# Patient Record
Sex: Female | Born: 1960 | Race: White | Hispanic: No | Smoking: Former smoker
Health system: Southern US, Community
[De-identification: ages and names within clinical notes are randomized; demographics above are authoritative.]

## PROBLEM LIST (undated history)

## (undated) DIAGNOSIS — E041 Nontoxic single thyroid nodule: Secondary | ICD-10-CM

## (undated) DIAGNOSIS — C801 Malignant (primary) neoplasm, unspecified: Secondary | ICD-10-CM

## (undated) DIAGNOSIS — I10 Essential (primary) hypertension: Secondary | ICD-10-CM

## (undated) DIAGNOSIS — M797 Fibromyalgia: Secondary | ICD-10-CM

## (undated) DIAGNOSIS — I499 Cardiac arrhythmia, unspecified: Secondary | ICD-10-CM

## (undated) DIAGNOSIS — K219 Gastro-esophageal reflux disease without esophagitis: Secondary | ICD-10-CM

## (undated) DIAGNOSIS — I471 Supraventricular tachycardia, unspecified: Secondary | ICD-10-CM

## (undated) DIAGNOSIS — K589 Irritable bowel syndrome without diarrhea: Secondary | ICD-10-CM

## (undated) DIAGNOSIS — M1990 Unspecified osteoarthritis, unspecified site: Secondary | ICD-10-CM

## (undated) HISTORY — PX: KNEE ARTHROPLASTY: SHX992

## (undated) HISTORY — DX: Supraventricular tachycardia: I47.1

## (undated) HISTORY — DX: Supraventricular tachycardia, unspecified: I47.10

## (undated) HISTORY — PX: ABDOMINAL HYSTERECTOMY: SHX81

---

## 2008-01-06 HISTORY — PX: HIP ARTHROSCOPY: SHX668

## 2017-04-15 ENCOUNTER — Other Ambulatory Visit: Payer: Self-pay | Admitting: Internal Medicine

## 2017-04-15 DIAGNOSIS — R5381 Other malaise: Secondary | ICD-10-CM

## 2017-04-19 ENCOUNTER — Other Ambulatory Visit: Payer: Self-pay | Admitting: Internal Medicine

## 2017-04-20 ENCOUNTER — Other Ambulatory Visit: Payer: Self-pay | Admitting: Internal Medicine

## 2017-04-20 DIAGNOSIS — E2839 Other primary ovarian failure: Secondary | ICD-10-CM

## 2017-05-18 ENCOUNTER — Ambulatory Visit
Admission: RE | Admit: 2017-05-18 | Discharge: 2017-05-18 | Disposition: A | Discharge status: Home / Self Care | Source: Ambulatory Visit | Attending: Internal Medicine | Admitting: Internal Medicine

## 2017-05-18 DIAGNOSIS — E2839 Other primary ovarian failure: Secondary | ICD-10-CM

## 2017-12-15 ENCOUNTER — Other Ambulatory Visit: Payer: Self-pay | Admitting: Gastroenterology

## 2017-12-15 DIAGNOSIS — R1084 Generalized abdominal pain: Secondary | ICD-10-CM

## 2017-12-22 ENCOUNTER — Other Ambulatory Visit

## 2017-12-27 ENCOUNTER — Other Ambulatory Visit

## 2017-12-31 ENCOUNTER — Ambulatory Visit
Admission: RE | Admit: 2017-12-31 | Discharge: 2017-12-31 | Disposition: A | Discharge status: Home / Self Care | Payer: Medicare Other | Source: Ambulatory Visit | Attending: Gastroenterology | Admitting: Gastroenterology

## 2017-12-31 ENCOUNTER — Encounter: Payer: Self-pay | Admitting: Radiology

## 2017-12-31 DIAGNOSIS — R1084 Generalized abdominal pain: Secondary | ICD-10-CM

## 2017-12-31 MED ORDER — IOPAMIDOL (ISOVUE-300) INJECTION 61%
100.0000 mL | Freq: Once | INTRAVENOUS | Status: AC | PRN
  Administered 2017-12-31: 100 mL via INTRAVENOUS

## 2019-02-25 ENCOUNTER — Ambulatory Visit: Payer: Medicare Other | Attending: Internal Medicine

## 2019-02-25 DIAGNOSIS — Z23 Encounter for immunization: Secondary | ICD-10-CM | POA: Insufficient documentation

## 2019-02-25 NOTE — Progress Notes (Signed)
   Covid-19 Vaccination Clinic  Name:  Quinci Perret    MRN: [id number] DOB: June 11, 1960  02/25/2019  Ms. Koesters was observed post Covid-19 immunization for 15 minutes without incidence. She was provided with Vaccine Information Sheet and instruction to access the V-Safe system.   Ms. Cata was instructed to call 911 with any severe reactions post vaccine: Marland Kitchen Difficulty breathing  . Swelling of your face and throat  . A fast heartbeat  . A bad rash all over your body  . Dizziness and weakness    Immunizations Administered    Name Date Dose VIS Date Route   Pfizer COVID-19 Vaccine 02/25/2019  8:28 AM 0.3 mL 12/16/2018 Intramuscular   Manufacturer: [place]   Lot: [id number]   [place]: [id number]

## 2019-03-21 ENCOUNTER — Ambulatory Visit: Payer: Medicare Other | Attending: Internal Medicine

## 2019-03-21 DIAGNOSIS — Z23 Encounter for immunization: Secondary | ICD-10-CM

## 2019-03-21 NOTE — Progress Notes (Signed)
   Covid-19 Vaccination Clinic  Name:  Amber Maldonado    MRN: [id number] DOB: June 11, 1960  03/21/2019  Amber Maldonado was observed post Covid-19 immunization for 15 minutes without incident. She was provided with Vaccine Information Sheet and instruction to access the V-Safe system.   Amber Maldonado was instructed to call 911 with any severe reactions post vaccine: Marland Kitchen Difficulty breathing  . Swelling of face and throat  . A fast heartbeat  . A bad rash all over body  . Dizziness and weakness   Immunizations Administered    Name Date Dose VIS Date Route   Pfizer COVID-19 Vaccine 03/21/2019  4:32 PM 0.3 mL 12/16/2018 Intramuscular   Manufacturer: [place]   Lot: [id number]   [place]: [id number]

## 2020-01-01 ENCOUNTER — Encounter (HOSPITAL_COMMUNITY): Payer: Self-pay

## 2020-01-01 ENCOUNTER — Emergency Department (HOSPITAL_COMMUNITY)
Admission: EM | Admit: 2020-01-01 | Discharge: 2020-01-01 | Disposition: A | Discharge status: Home / Self Care | Payer: Medicare Other | Attending: Emergency Medicine | Admitting: Emergency Medicine

## 2020-01-01 ENCOUNTER — Other Ambulatory Visit: Payer: Self-pay

## 2020-01-01 DIAGNOSIS — I471 Supraventricular tachycardia: Secondary | ICD-10-CM | POA: Insufficient documentation

## 2020-01-01 DIAGNOSIS — I10 Essential (primary) hypertension: Secondary | ICD-10-CM | POA: Diagnosis not present

## 2020-01-01 DIAGNOSIS — Z79899 Other long term (current) drug therapy: Secondary | ICD-10-CM | POA: Diagnosis not present

## 2020-01-01 DIAGNOSIS — Z96659 Presence of unspecified artificial knee joint: Secondary | ICD-10-CM | POA: Diagnosis not present

## 2020-01-01 DIAGNOSIS — Z20822 Contact with and (suspected) exposure to covid-19: Secondary | ICD-10-CM | POA: Insufficient documentation

## 2020-01-01 HISTORY — DX: Irritable bowel syndrome, unspecified: K58.9

## 2020-01-01 HISTORY — DX: Essential (primary) hypertension: I10

## 2020-01-01 LAB — CBC WITH DIFFERENTIAL/PLATELET
Abs Immature Granulocytes: 0.03 10*3/uL (ref 0.00–0.07)
Basophils Absolute: 0 10*3/uL (ref 0.0–0.1)
Basophils Relative: 0 %
Eosinophils Absolute: 0.1 10*3/uL (ref 0.0–0.5)
Eosinophils Relative: 1 %
HCT: 47.4 % — ABNORMAL HIGH (ref 36.0–46.0)
Hemoglobin: 16.4 g/dL — ABNORMAL HIGH (ref 12.0–15.0)
Immature Granulocytes: 0 %
Lymphocytes Relative: 30 %
Lymphs Abs: 2.9 10*3/uL (ref 0.7–4.0)
MCH: 33.3 pg (ref 26.0–34.0)
MCHC: 34.6 g/dL (ref 30.0–36.0)
MCV: 96.3 fL (ref 80.0–100.0)
Monocytes Absolute: 0.6 10*3/uL (ref 0.1–1.0)
Monocytes Relative: 6 %
Neutro Abs: 6.1 10*3/uL (ref 1.7–7.7)
Neutrophils Relative %: 63 %
Platelets: 227 10*3/uL (ref 150–400)
RBC: 4.92 MIL/uL (ref 3.87–5.11)
RDW: 12.6 % (ref 11.5–15.5)
WBC: 9.8 10*3/uL (ref 4.0–10.5)
nRBC: 0 % (ref 0.0–0.2)

## 2020-01-01 LAB — BASIC METABOLIC PANEL
Anion gap: 11 (ref 5–15)
BUN: 19 mg/dL (ref 6–20)
CO2: 18 mmol/L — ABNORMAL LOW (ref 22–32)
Calcium: 9.2 mg/dL (ref 8.9–10.3)
Chloride: 112 mmol/L — ABNORMAL HIGH (ref 98–111)
Creatinine, Ser: 0.81 mg/dL (ref 0.44–1.00)
GFR, Estimated: >60 mL/min (ref >60)
Glucose, Bld: 123 mg/dL — ABNORMAL HIGH (ref 70–99)
Potassium: 3.7 mmol/L (ref 3.5–5.1)
Sodium: 141 mmol/L (ref 135–145)

## 2020-01-01 LAB — SARS CORONAVIRUS 2 (TAT 6-24 HRS): SARS Coronavirus 2: NEGATIVE

## 2020-01-01 LAB — TROPONIN I (HIGH SENSITIVITY)
Troponin I (High Sensitivity): 5 ng/L (ref <18)
Troponin I (High Sensitivity): 9 ng/L (ref <18)

## 2020-01-01 LAB — MAGNESIUM: Magnesium: 2.1 mg/dL (ref 1.7–2.4)

## 2020-01-01 MED ORDER — LACTATED RINGERS IV BOLUS
1000.0000 mL | Freq: Once | INTRAVENOUS | Status: AC
  Administered 2020-01-01: 12:00:00 1000 mL via INTRAVENOUS

## 2020-01-01 NOTE — ED Triage Notes (Signed)
Pt bib ems for SVT. 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 When EMS arrived pt was in SVT at 170s. Pt given 6mg  adenosine with no changes, then given 12 mg adenosine and converted to ST at 120. En route pt went back into SVT at 170s. Pt given 20mg  cadizem and converted to ST again 120s. Pt a.o, BP stable

## 2020-01-01 NOTE — ED Provider Notes (Signed)
[facility] EMERGENCY DEPARTMENT Provider Note   CSN: [id number] Arrival date & time: 01/01/20  1057     History Chief Complaint  Patient presents with  . Tachycardia    Amber Maldonado is a 59 y.o. female.  Patient had upset stomach, palpitations, was seen by EMS providers and found to be in SVT, given adenosine 6 mg and 12 mg, converted, then went back into SVT and was given 20 mg of Cardizem and then converted to sinus tachycardia.  She states this was new, she has a history of constipation and takes MiraLAX, usually when she does take MiraLAX it makes her stomach upset a little bit.  She says this happened to her once before but not nearly as severe, it happened several weeks ago.  She has no medical problems related to her heart other than high blood pressure and takes medication for it.  She denies any current chest pain any current shortness of breath and is much really resolved symptoms of palpitations perioral numbness tingling and shortness of breath.  Recent sick contacts no recent illness no recent trauma.        Past Medical History:  Diagnosis Date  . Hypertension   . Irritable bowel disease     There are no problems to display for this patient.   Past Surgical History:  Procedure Laterality Date  . KNEE ARTHROPLASTY       OB History   No obstetric history on file.     No family history on file.     Home Medications Prior to Admission medications   Medication Sig Start Date End Date Taking? Authorizing Provider  acetaminophen (TYLENOL) 500 MG tablet Take 1,000 mg by mouth 2 (two) times daily.   Yes [provider]  amoxicillin (AMOXIL) 500 MG capsule Take 2,000 mg by mouth See admin instructions. Take four capsules (2000 mg) by mouth one hour before dental appointments 08/04/19  Yes [provider]  Ascorbic Acid (VITAMIN C) 1000 MG tablet Take 1,000 mg by mouth every evening.   Yes [provider]   cholecalciferol (VITAMIN D) 25 MCG (1000 UNIT) tablet Take 3,000 Units by mouth every evening.   Yes [provider]  cyanocobalamin (,VITAMIN B-12,) 1000 MCG/ML injection Inject 1,000 mcg into the muscle every 30 (thirty) days.   Yes [provider]  dicyclomine (BENTYL) 20 MG tablet Take 20 mg by mouth 2 (two) times daily.   Yes [provider]  FIBER ADULT GUMMIES PO Take 1 tablet by mouth every evening.   Yes [provider]  lisinopril (ZESTRIL) 5 MG tablet Take 5 mg by mouth daily. 12/15/19  Yes [provider]  meloxicam (MOBIC) 15 MG tablet Take 7.5 mg by mouth daily. 12/25/19  Yes [provider]  Nutritional Supplements (ESTROVEN PO) Take 1 tablet by mouth daily.   Yes [provider]  pantoprazole (PROTONIX) 40 MG tablet Take 40 mg by mouth daily. 09/19/19  Yes [provider]  phentermine (ADIPEX-P) 37.5 MG tablet Take 18.75 mg by mouth See admin instructions. Take 1/2 tablet (18.75 mg) by mouth three times weekly as needed for appetite suppresant   Yes [provider]  polyethylene glycol (MIRALAX / GLYCOLAX) 17 g packet Take 17 g by mouth daily as needed (constipation).   Yes [provider]  topiramate (TOPAMAX) 50 MG tablet Take 50 mg by mouth 2 (two) times daily. 11/16/19  Yes [provider]  vitamin B-12 (CYANOCOBALAMIN) 1000 MCG  tablet Take 1,000 mcg by mouth daily.   Yes [provider]  Zinc 50 MG TABS Take 50 mg by mouth daily.   Yes [provider]    Allergies    Triamcinolone  Review of Systems   Review of Systems  Constitutional: Negative for chills and fever.  HENT: Negative for congestion and rhinorrhea.   Respiratory: Positive for shortness of breath. Negative for cough.   Cardiovascular: Positive for palpitations. Negative for chest pain.  Gastrointestinal: Positive for diarrhea. Negative for nausea and vomiting.  Genitourinary: Negative for  difficulty urinating and dysuria.  Musculoskeletal: Negative for arthralgias and back pain.  Skin: Negative for rash and wound.  Neurological: Positive for light-headedness and numbness (perioral). Negative for headaches.    Physical Exam Updated Vital Signs BP 127/87   Pulse 98   Temp 97.9 F (36.6 C) (Oral)   Resp 13   Ht 5' (1.524 m)   Wt 71.2 kg   SpO2 100%   BMI 30.66 kg/m   Physical Exam Vitals and nursing note reviewed. Exam conducted with a chaperone present.  Constitutional:      General: She is not in acute distress.    Appearance: Normal appearance.  HENT:     Head: Normocephalic and atraumatic.     Nose: No rhinorrhea.  Eyes:     General:        Right eye: No discharge.        Left eye: No discharge.     Conjunctiva/sclera: Conjunctivae normal.  Cardiovascular:     Rate and Rhythm: Regular rhythm. Tachycardia present.     Heart sounds: No murmur heard. No gallop.   Pulmonary:     Effort: Pulmonary effort is normal. No respiratory distress.     Breath sounds: No stridor.  Abdominal:     General: Abdomen is flat. There is no distension.     Palpations: Abdomen is soft.     Tenderness: There is no abdominal tenderness.  Musculoskeletal:        General: No tenderness or signs of injury.  Skin:    General: Skin is warm and dry.     Capillary Refill: Capillary refill takes less than 2 seconds.  Neurological:     General: No focal deficit present.     Mental Status: She is alert. Mental status is at baseline.     Motor: No weakness.  Psychiatric:        Mood and Affect: Mood normal.        Behavior: Behavior normal.     ED Results / Procedures / Treatments   Labs (all labs ordered are listed, but only abnormal results are displayed) Labs Reviewed  CBC WITH DIFFERENTIAL/PLATELET - Abnormal; Notable for the following components:      Result Value   Hemoglobin 16.4 (*)    HCT 47.4 (*)    All other components within normal limits  BASIC METABOLIC  PANEL - Abnormal; Notable for the following components:   Chloride 112 (*)    CO2 18 (*)    Glucose, Bld 123 (*)    All other components within normal limits  SARS CORONAVIRUS 2 (TAT 6-24 HRS)  MAGNESIUM  TROPONIN I (HIGH SENSITIVITY)  TROPONIN I (HIGH SENSITIVITY)    EKG EKG Interpretation  Date/Time:  Monday January 01 2020 10:58:20 EST Ventricular Rate:  117 PR Interval:    QRS Duration: 88 QT Interval:  330 QTC Calculation: 461 R Axis:   81 Text Interpretation: Sinus  tachycardia Right atrial enlargement Borderline ST depression, diffuse leads Confirmed by Dewaine Conger [phone] on 01/01/2020 11:38:24 AM   Radiology No results found.  Procedures Procedures (including critical care time)  Medications Ordered in ED Medications  lactated ringers bolus 1,000 mL (0 mLs Intravenous Stopped 01/01/20 1243)    ED Course  I have reviewed the triage vital signs and the nursing notes.  Pertinent labs & imaging results that were available during my care of the patient were reviewed by me and considered in my medical decision making (see chart for details).    MDM Rules/Calculators/A&P                          SVT, patient does take stimulant for appetite suppression for weight loss.  Also is having very loose stools in large quantities today secondary to MiraLAX use to treat constipation.  Sinus tachycardia without any specific focal acute ischemic change.  We will repeat as there is a lot of wander.  Will get IV fluids will get electrolytes.  Covid test.  Will get cardiac biomarkers.  Patient symptoms are much resolved after treatment.  Will likely touch base with cardiology to help arrange follow-up.  For troponins negative.  Viral testing pending.  Other labs fairly unremarkable except for nonanion gap metabolic acidosis likely secondary to the loose stools.  I spoke to the cardiologist, they recommend outpatient follow-up with return precautions.  Waiting for second troponin and  discharge.  Second troponin shows a level of 9 change of 4. Except amount of climb specially in the setting of recent SVT. Safe for discharge home outpatient follow-up return precautions provided  Final Clinical Impression(s) / ED Diagnoses Final diagnoses:  SVT (supraventricular tachycardia) ([place])    Rx / DC Orders ED Discharge Orders    None       Breck Coons, MD 01/01/20 1420

## 2020-01-01 NOTE — Discharge Instructions (Addendum)
Stay well-hydrated, follow-up with your primary care provider.  The cardiologist will help you schedule appointment, call their office.  Return to [place] with any concerns or symptoms that you cannot resolve.  Avoid using the appetite suppressant medication until seen by the cardiologist.

## 2020-01-03 ENCOUNTER — Ambulatory Visit: Payer: Medicare Other | Admitting: Cardiovascular Disease

## 2020-01-17 ENCOUNTER — Ambulatory Visit: Payer: Medicare Other | Admitting: Internal Medicine

## 2020-02-01 ENCOUNTER — Ambulatory Visit (INDEPENDENT_AMBULATORY_CARE_PROVIDER_SITE_OTHER): Payer: Medicare Other | Admitting: Internal Medicine

## 2020-02-01 ENCOUNTER — Encounter: Payer: Self-pay | Admitting: Internal Medicine

## 2020-02-01 ENCOUNTER — Other Ambulatory Visit: Payer: Self-pay

## 2020-02-01 VITALS — BP 120/80 | HR 72 | Ht 60.0 in | Wt 163.0 lb

## 2020-02-01 DIAGNOSIS — I471 Supraventricular tachycardia, unspecified: Secondary | ICD-10-CM | POA: Insufficient documentation

## 2020-02-01 DIAGNOSIS — I10 Essential (primary) hypertension: Secondary | ICD-10-CM

## 2020-02-01 MED ORDER — DILTIAZEM HCL 30 MG PO TABS: 30.0000 mg | ORAL_TABLET | Freq: Four times a day (QID) | ORAL | 0 refills | Status: DC | PRN

## 2020-02-01 NOTE — Progress Notes (Signed)
Cardiology Office Note:    Date:  02/01/2020   ID:  Amber Maldonado, DOB 10-Jul-1960, MRN [id number]  PCP:  [name], MD  [facility] Cardiologist:  No primary care provider on file.  [facility] Electrophysiologist:  None   CC: Palpitations Consulted for the evaluation of SVT at the behest of [name], MD  History of Present Illness:    Amber Maldonado is a 59 y.o. female with a hx of HTN, SVT who presents for evaluation.  Patient notes that she is feeling fine after her ED visit.  Patient had rare PVCs when she was in her doctor's office (worked at [employer]).01/01/20 had sudden onset tachycardia when she was going to work.  Had felt like she was walking her 3 miles and felt her lips going numb., Had ED visit for tachycardia 01/01/20:  BIBEMS with heart rate of 170s NOS.  Cessation with adenosine and diltiazem.  ED course was unremarkable.   Has had no chest pain, chest pressure, chest tightness, chest stinging .  Discomfort  Has not occurred since.  Patient exertion notable for walking three miles a day with  and feels no symptoms.  No shortness of breath, DOE .  No PND or orthopnea.  No bendopnea, weight gain, leg swelling , or abdominal swelling.  No syncope or near syncope.  Patient reports prior cardiac testing including  distant stress test.  No history of pre-eclampsia or prematurity.  No Fen-Phen or drug use.  Past Medical History:  Diagnosis Date  . Hypertension   . Irritable bowel disease   . SVT (supraventricular tachycardia) (HCC)     Past Surgical History:  Procedure Laterality Date  . KNEE ARTHROPLASTY      Current Medications: Current Meds  Medication Sig  . acetaminophen (TYLENOL) 500 MG tablet Take 1,000 mg by mouth 2 (two) times daily.  Marland Kitchen amoxicillin (AMOXIL) 500 MG capsule Take 2,000 mg by mouth See admin instructions. Take four capsules (2000 mg) by mouth one hour before dental appointments  . Ascorbic Acid (VITAMIN C) 1000 MG  tablet Take 1,000 mg by mouth every evening.  . cholecalciferol (VITAMIN D) 25 MCG (1000 UNIT) tablet Take 3,000 Units by mouth every evening.  . cyanocobalamin (,VITAMIN B-12,) 1000 MCG/ML injection Inject 1,000 mcg into the muscle every 30 (thirty) days.  Marland Kitchen dicyclomine (BENTYL) 20 MG tablet Take 20 mg by mouth 2 (two) times daily.  Marland Kitchen FIBER ADULT GUMMIES PO Take 1 tablet by mouth every evening.  Marland Kitchen lisinopril (ZESTRIL) 5 MG tablet Take 5 mg by mouth daily.  . meloxicam (MOBIC) 15 MG tablet Take 7.5 mg by mouth daily.  . Nutritional Supplements (ESTROVEN PO) Take 1 tablet by mouth daily.  . pantoprazole (PROTONIX) 40 MG tablet Take 40 mg by mouth daily.  . polyethylene glycol (MIRALAX / GLYCOLAX) 17 g packet Take 17 g by mouth daily as needed (constipation).  . Saccharomyces boulardii (PROBIOTIC) 250 MG CAPS Take by mouth daily in the afternoon.  . topiramate (TOPAMAX) 50 MG tablet Take 50 mg by mouth as needed.  . Zinc 50 MG TABS Take 50 mg by mouth daily.  . [DISCONTINUED] vitamin B-12 (CYANOCOBALAMIN) 1000 MCG tablet Take 1,000 mcg by mouth daily.     Allergies:   Triamcinolone   Social History   Socioeconomic History  . Marital status: Legally Separated    Spouse name: Not on file  . Number of children: Not on file  . Years of education: Not on file  .  Highest education level: Not on file  Occupational History  . Not on file  Tobacco Use  . Smoking status: Former Research scientist (life sciences)  . Smokeless tobacco: Never Used  Substance and Sexual Activity  . Alcohol use: Yes    Comment: occassional  . Drug use: Never  . Sexual activity: Not Currently  Other Topics Concern  . Not on file  Social History Narrative  . 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  [place].  Mother has squamous cell cancer  Family History: History of coronary artery disease notable for no members. History of heart failure notable for no members. History of arrhythmia notable for no members. Doesn't know her biological father well.  ROS:   Please see the history of present illness.     All other systems reviewed and are negative.  EKGs/Labs/Other Studies Reviewed:    The following studies were reviewed today:  EKG:   02/01/20: Sinus Tachycardia RAE rate 100 nonspecific TWI  Recent Labs: 01/01/2020: BUN 19; Creatinine, Ser 0.81; Hemoglobin 16.4; Magnesium 2.1; Platelets 227; Potassium 3.7; Sodium 141  Recent Lipid Panel No results found for: CHOL, TRIG, HDL, CHOLHDL, VLDL, LDLCALC, LDLDIRECT   Risk Assessment/Calculations:     N/A  Physical Exam:    VS:  BP 120/80   Pulse 72   Ht 5' (1.524 m)   Wt 163 lb (73.9 kg)   SpO2 99%   BMI 31.83 kg/m     Wt Readings from Last 3 Encounters:  02/01/20 163 lb (73.9 kg)  01/01/20 157 lb (71.2 kg)    GEN:  Well nourished, well developed in no acute distress HEENT: Normal NECK: No JVD; No carotid bruits LYMPHATICS: No lymphadenopathy CARDIAC: RRR, no murmurs, rubs, gallops RESPIRATORY:  Clear to auscultation without rales, wheezing or rhonchi  ABDOMEN: Soft, non-tender, non-distended MUSCULOSKELETAL:  No edema; No deformity  SKIN: Warm and dry NEUROLOGIC:  Alert and oriented x 3 PSYCHIATRIC:  Normal affect   ASSESSMENT:    No diagnosis found. PLAN:    In order of problems listed above:  Palpitations; possible SVT - will obtain 30-day heart monitor ([employer]) - AV Nodal Therapy: will start wil diltiazem 30 mo PO PRN  Essential Hypertension - ambulatory blood pressure < 135/85, willcontinue ambulatory BP monitoring; gave education on how to perform ambulatory blood pressure monitoring including the frequency and technique; goal ambulatory blood pressure < 135/85 on average - continue home  medications with the exception of above - discussed diet (DASH/low sodium), and exercise/weight loss interventions   Three months follow up unless new symptoms or abnormal test results warranting change in plan  Would be reasonable for  Virtual Follow up Would be reasonable for  APP Follow up   Medication Adjustments/Labs and Tests Ordered: Current medicines are reviewed at length with the patient today.  Concerns regarding medicines are outlined above.  No orders of the defined types were placed in this encounter.  No orders of the defined types were placed in this encounter.   There are no Patient Instructions on file for this visit.   Signed, Werner Lean, MD  02/01/2020 8:38 AM    [place]

## 2020-02-01 NOTE — Patient Instructions (Signed)
Medication Instructions:  1) START DILTIAZEM 30 mg up to four times daily as needed when your heart rate is fast *If you need a refill on your cardiac medications before your next appointment, please call your pharmacy*  Testing/Procedures: Dr. Dennison Bulla recommends you wear a heart monitor for 30 days.  Follow-Up: Your provider recommends that you schedule a follow-up appointment in 2-3 months.

## 2020-02-14 ENCOUNTER — Ambulatory Visit (INDEPENDENT_AMBULATORY_CARE_PROVIDER_SITE_OTHER): Payer: Medicare Other

## 2020-02-14 DIAGNOSIS — I471 Supraventricular tachycardia: Secondary | ICD-10-CM | POA: Diagnosis not present

## 2020-02-27 ENCOUNTER — Other Ambulatory Visit: Payer: Self-pay | Admitting: Internal Medicine

## 2020-04-12 ENCOUNTER — Ambulatory Visit (INDEPENDENT_AMBULATORY_CARE_PROVIDER_SITE_OTHER): Payer: Medicare Other | Admitting: Internal Medicine

## 2020-04-12 ENCOUNTER — Encounter: Payer: Self-pay | Admitting: Internal Medicine

## 2020-04-12 ENCOUNTER — Other Ambulatory Visit: Payer: Self-pay

## 2020-04-12 VITALS — BP 126/68 | HR 73 | Ht 60.0 in | Wt 162.0 lb

## 2020-04-12 DIAGNOSIS — I471 Supraventricular tachycardia: Secondary | ICD-10-CM | POA: Diagnosis not present

## 2020-04-12 DIAGNOSIS — I10 Essential (primary) hypertension: Secondary | ICD-10-CM | POA: Diagnosis not present

## 2020-04-12 DIAGNOSIS — I700 Atherosclerosis of aorta: Secondary | ICD-10-CM

## 2020-04-12 DIAGNOSIS — R002 Palpitations: Secondary | ICD-10-CM | POA: Insufficient documentation

## 2020-04-12 NOTE — Patient Instructions (Addendum)
Medication Instructions:  Your physician recommends that you continue on your current medications as directed. Please refer to the Current Medication list given to you today.  *If you need a refill on your cardiac medications before your next appointment, please call your pharmacy*   Lab Work: Tuesday April 16, 2020 : Fasting lipid panel If you have labs (blood work) drawn today and your tests are completely normal, you will receive your results only by: Marland Kitchen MyChart Message (if you have MyChart) OR . A paper copy in the mail If you have any lab test that is abnormal or we need to change your treatment, we will call you to review the results.   Testing/Procedures: NONE   Follow-Up: At [facility], you and your health needs are our priority.  As part of our continuing mission to provide you with exceptional heart care, we have created designated Provider Care Teams.  These Care Teams include your primary Cardiologist (physician) and Advanced Practice Providers (APPs -  Physician Assistants and Nurse Practitioners) who all work together to provide you with the care you need, when you need it.  We recommend signing up for the patient portal called "MyChart".  Sign up information is provided on this After Visit Summary.  MyChart is used to connect with patients for Virtual Visits (Telemedicine).  Patients are able to view lab/test results, encounter notes, upcoming appointments, etc.  Non-urgent messages can be sent to your provider as well.   To learn more about what you can do with MyChart, go to [email or web address].    Your next appointment:   12 month(s)  The format for your next appointment:   In Person  Provider:   You may see Gasper Sells, MD or one of the following Advanced Practice Providers on your designated Care Team:    Melina Copa, PA-C  Ermalinda Barrios, PA-C

## 2020-04-12 NOTE — Progress Notes (Signed)
Cardiology Office Note:    Date:  04/12/2020   ID:  Amber Maldonado, DOB 03-28-1960, MRN [id number]  PCP:  [name], MD  [facility] Cardiologist:  Rudean Haskell MD [place] Electrophysiologist:  None   CC: SVT Follow up  History of Present Illness:    Amber Maldonado is a 60 y.o. female with a hx of HTN, SVT who presents for evaluation 02/01/20.  In interim of this visit, patient heart monitor with no further SVT.  Patient notes that she is doing well.  Since last visit notes no palpitations changes.  Relevant interval testing or therapy include negative ZioPatch.  There are no interval hospital/ED visit.  Started on anxiety medication, but hasn't taken any yet.  No chest pain or pressure.  No SOB/DOE and no PND/Orthopnea.  No weight gain or leg swelling.  No palpitations or syncope since last visit.  Ambulatory blood pressure 120/70s.   Past Medical History:  Diagnosis Date  . Hypertension   . Irritable bowel disease   . SVT (supraventricular tachycardia) (HCC)     Past Surgical History:  Procedure Laterality Date  . KNEE ARTHROPLASTY      Current Medications: Current Meds  Medication Sig  . acetaminophen (TYLENOL) 500 MG tablet Take 1,000 mg by mouth 2 (two) times daily.  Marland Kitchen amoxicillin (AMOXIL) 500 MG capsule Take 2,000 mg by mouth See admin instructions. Take four capsules (2000 mg) by mouth one hour before dental appointments  . Ascorbic Acid (VITAMIN C) 1000 MG tablet Take 1,000 mg by mouth every evening.  . cholecalciferol (VITAMIN D) 25 MCG (1000 UNIT) tablet Take 3,000 Units by mouth every evening.  . cyanocobalamin (,VITAMIN B-12,) 1000 MCG/ML injection Inject 1,000 mcg into the muscle every 30 (thirty) days.  Marland Kitchen dicyclomine (BENTYL) 20 MG tablet Take 20 mg by mouth 2 (two) times daily.  Marland Kitchen diltiazem (CARDIZEM) 30 MG tablet TAKE 1 TABLET(30 MG) BY MOUTH FOUR TIMES DAILY AS NEEDED FOR FAST HEART RATE  . FIBER ADULT GUMMIES PO Take 1 tablet by mouth  every evening.  Marland Kitchen lisinopril (ZESTRIL) 5 MG tablet Take 5 mg by mouth daily.  . meloxicam (MOBIC) 15 MG tablet Take 7.5 mg by mouth daily.  . Nutritional Supplements (ESTROVEN PO) Take 1 tablet by mouth daily.  . pantoprazole (PROTONIX) 40 MG tablet Take 40 mg by mouth daily.  . polyethylene glycol (MIRALAX / GLYCOLAX) 17 g packet Take 17 g by mouth daily as needed (constipation).  . Saccharomyces boulardii (PROBIOTIC) 250 MG CAPS Take by mouth daily in the afternoon.  . topiramate (TOPAMAX) 50 MG tablet Take 50 mg by mouth as needed.  . Zinc 50 MG TABS Take 50 mg by mouth daily.     Allergies:   Triamcinolone   Social History   Socioeconomic History  . Marital status: Legally Separated    Spouse name: Not on file  . Number of children: Not on file  . Years of education: Not on file  . Highest education level: Not on file  Occupational History  . Not on file  Tobacco Use  . Smoking status: Former Research scientist (life sciences)  . Smokeless tobacco: Never Used  Substance and Sexual Activity  . Alcohol use: Yes    Comment: occassional  . Drug use: Never  . Sexual activity: Not Currently  Other Topics Concern  . Not on file  Social History Narrative  . 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  Mother has squamous cell cancer Daughter has premature baby 19 weeks recently  Family History: History of coronary artery disease notable for no members. History of heart failure notable for no members. History of arrhythmia notable for no members. Doesn't know her biological father well.  ROS:   Please see the history of present illness.     All other systems reviewed and are negative.  EKGs/Labs/Other Studies Reviewed:    The following studies were  reviewed today:  EKG:   02/01/20: Sinus Tachycardia RAE rate 100 nonspecific TWI  Cardiac Event Monitoring: Date: 01/01/20 Results:  Patient had a minimum heart rate of 50 bpm, maximum heart rate of 147 bpm, and average heart rate of 78 bpm.  Predominant underlying rhythm was sinus rhythm.  Isolated PACs were rare (<1.0%).  Isolated PVCs were rare (<1.0%).  No evidence of atrial fibrillation.  Triggered and diary events associated with sinus rhythm and sinus tachycardia.   No malignant arrhythmias.  NonCardiac CT: Date 12/31/17 Results: Descending Aortic Atherosclerosis  Recent Labs: 01/01/2020: BUN 19; Creatinine, Ser 0.81; Hemoglobin 16.4; Magnesium 2.1; Platelets 227; Potassium 3.7; Sodium 141  Recent Lipid Panel No results found for: CHOL, TRIG, HDL, CHOLHDL, VLDL, LDLCALC, LDLDIRECT  Outside Hospital  or Outside Clinic Studies (OSH):  Date: 08/2019 Cholesterol 178 HDL 72 LDL 86 Tgs 111   Risk Assessment/Calculations:     N/A  Physical Exam:    VS:  BP 126/68   Pulse 73   Ht 5' (1.524 m)   Wt 162 lb (73.5 kg)   SpO2 99%   BMI 31.64 kg/m     Wt Readings from Last 3 Encounters:  04/12/20 162 lb (73.5 kg)  02/01/20 163 lb (73.9 kg)  01/01/20 157 lb (71.2 kg)    GEN:  Well nourished, well developed in no acute distress HEENT: Normal NECK: No JVD; No carotid bruits LYMPHATICS: No lymphadenopathy CARDIAC: RRR, no murmurs, rubs, gallops RESPIRATORY:  Clear to auscultation without rales, wheezing or rhonchi  ABDOMEN: Soft, non-tender, non-distended MUSCULOSKELETAL:  No edema; No deformity  SKIN: Warm and dry NEUROLOGIC:  Alert and oriented x 3 PSYCHIATRIC:  Normal affect   ASSESSMENT:    1. SVT (supraventricular tachycardia) ([place])   2. Essential hypertension   3. Aortic atherosclerosis ([facility])    PLAN:    In order of problems listed above:  Aortic Atherosclerosis -LDL goal less than 70 -continue current statin - gave education on dietary  changes - Reviewed CT scan with patient - discussed the [place] affect of statins  SVT - AV Nodal Therapy: continue diltiazem 30 mo PO PRN  Essential Hypertension - ambulatory blood pressure 120/70s, will continue ambulatory BP monitoring; gave education on how to perform ambulatory blood pressure monitoring including the frequency and technique; goal ambulatory blood pressure < 130/80 on average - continue home medications with the exception of above - discussed diet (DASH/low sodium), and exercise/weight loss interventions   One year follow up unless new symptoms or abnormal test results warranting change in plan  Would be reasonable for  APP Follow up   Medication Adjustments/Labs and Tests Ordered: Current medicines are reviewed at length with the patient today.  Concerns regarding medicines are outlined above.  Orders Placed This Encounter  Procedures  . Lipid  panel   No orders of the defined types were placed in this encounter.   Patient Instructions  Medication Instructions:  Your physician recommends that you continue on your current medications as directed. Please refer to the Current Medication list given to you today.  *If you need a refill on your cardiac medications before your next appointment, please call your pharmacy*   Lab Work: Tuesday April 16, 2020 : Fasting lipid panel If you have labs (blood work) drawn today and your tests are completely normal, you will receive your results only by: Marland Kitchen MyChart Message (if you have MyChart) OR . A paper copy in the mail If you have any lab test that is abnormal or we need to change your treatment, we will call you to review the results.   Testing/Procedures: NONE   Follow-Up: At [facility], you and your health needs are our priority.  As part of our continuing mission to provide you with exceptional heart care, we have created designated Provider Care Teams.  These Care Teams include your primary Cardiologist  (physician) and Advanced Practice Providers (APPs -  Physician Assistants and Nurse Practitioners) who all work together to provide you with the care you need, when you need it.  We recommend signing up for the patient portal called "MyChart".  Sign up information is provided on this After Visit Summary.  MyChart is used to connect with patients for Virtual Visits (Telemedicine).  Patients are able to view lab/test results, encounter notes, upcoming appointments, etc.  Non-urgent messages can be sent to your provider as well.   To learn more about what you can do with MyChart, go to [email or web address].    Your next appointment:   12 month(s)  The format for your next appointment:   In Person  Provider:   You may see Gasper Sells, MD or one of the following Advanced Practice Providers on your designated Care Team:    Melina Copa, PA-C  Ermalinda Barrios, PA-C         Signed, Werner Lean, MD  04/12/2020 8:56 AM    [place]

## 2020-04-16 ENCOUNTER — Other Ambulatory Visit: Payer: Medicare Other

## 2021-02-06 ENCOUNTER — Other Ambulatory Visit: Payer: Self-pay

## 2021-02-06 ENCOUNTER — Emergency Department (HOSPITAL_COMMUNITY): Payer: Medicare Other

## 2021-02-06 ENCOUNTER — Inpatient Hospital Stay (HOSPITAL_COMMUNITY)
Admission: EM | Admit: 2021-02-06 | Discharge: 2021-02-09 | DRG: 871 | Disposition: A | Discharge status: Home / Self Care | Payer: Medicare Other | Attending: Internal Medicine | Admitting: Internal Medicine

## 2021-02-06 ENCOUNTER — Encounter (HOSPITAL_COMMUNITY): Payer: Self-pay

## 2021-02-06 DIAGNOSIS — E041 Nontoxic single thyroid nodule: Secondary | ICD-10-CM | POA: Diagnosis present

## 2021-02-06 DIAGNOSIS — B9620 Unspecified Escherichia coli [E. coli] as the cause of diseases classified elsewhere: Secondary | ICD-10-CM | POA: Diagnosis present

## 2021-02-06 DIAGNOSIS — A419 Sepsis, unspecified organism: Principal | ICD-10-CM | POA: Diagnosis present

## 2021-02-06 DIAGNOSIS — E538 Deficiency of other specified B group vitamins: Secondary | ICD-10-CM | POA: Diagnosis present

## 2021-02-06 DIAGNOSIS — N12 Tubulo-interstitial nephritis, not specified as acute or chronic: Secondary | ICD-10-CM

## 2021-02-06 DIAGNOSIS — Z96659 Presence of unspecified artificial knee joint: Secondary | ICD-10-CM | POA: Diagnosis present

## 2021-02-06 DIAGNOSIS — M797 Fibromyalgia: Secondary | ICD-10-CM | POA: Diagnosis present

## 2021-02-06 DIAGNOSIS — I471 Supraventricular tachycardia: Secondary | ICD-10-CM | POA: Diagnosis present

## 2021-02-06 DIAGNOSIS — N10 Acute tubulo-interstitial nephritis: Secondary | ICD-10-CM | POA: Diagnosis not present

## 2021-02-06 DIAGNOSIS — K219 Gastro-esophageal reflux disease without esophagitis: Secondary | ICD-10-CM | POA: Diagnosis present

## 2021-02-06 DIAGNOSIS — E876 Hypokalemia: Secondary | ICD-10-CM | POA: Diagnosis not present

## 2021-02-06 DIAGNOSIS — Z888 Allergy status to other drugs, medicaments and biological substances status: Secondary | ICD-10-CM

## 2021-02-06 DIAGNOSIS — E669 Obesity, unspecified: Secondary | ICD-10-CM | POA: Diagnosis present

## 2021-02-06 DIAGNOSIS — I10 Essential (primary) hypertension: Secondary | ICD-10-CM

## 2021-02-06 DIAGNOSIS — U071 COVID-19: Secondary | ICD-10-CM | POA: Diagnosis present

## 2021-02-06 DIAGNOSIS — Z7982 Long term (current) use of aspirin: Secondary | ICD-10-CM

## 2021-02-06 DIAGNOSIS — Z87891 Personal history of nicotine dependence: Secondary | ICD-10-CM

## 2021-02-06 DIAGNOSIS — Z79899 Other long term (current) drug therapy: Secondary | ICD-10-CM

## 2021-02-06 DIAGNOSIS — Z6830 Body mass index (BMI) 30.0-30.9, adult: Secondary | ICD-10-CM

## 2021-02-06 LAB — CBC WITH DIFFERENTIAL/PLATELET
Abs Immature Granulocytes: 0.18 10*3/uL — ABNORMAL HIGH (ref 0.00–0.07)
Abs Immature Granulocytes: 0.19 10*3/uL — ABNORMAL HIGH (ref 0.00–0.07)
Basophils Absolute: 0.1 10*3/uL (ref 0.0–0.1)
Basophils Absolute: 0.1 10*3/uL (ref 0.0–0.1)
Basophils Relative: 0 %
Basophils Relative: 0 %
Eosinophils Absolute: 0 10*3/uL (ref 0.0–0.5)
Eosinophils Absolute: 0 10*3/uL (ref 0.0–0.5)
Eosinophils Relative: 0 %
Eosinophils Relative: 0 %
HCT: 37.3 % (ref 36.0–46.0)
HCT: 32 % — ABNORMAL LOW (ref 36.0–46.0)
Hemoglobin: 12.5 g/dL (ref 12.0–15.0)
Hemoglobin: 10.5 g/dL — ABNORMAL LOW (ref 12.0–15.0)
Immature Granulocytes: 1 %
Immature Granulocytes: 1 %
Lymphocytes Relative: 5 %
Lymphocytes Relative: 10 %
Lymphs Abs: 1 10*3/uL (ref 0.7–4.0)
Lymphs Abs: 1.8 10*3/uL (ref 0.7–4.0)
MCH: 30.4 pg (ref 26.0–34.0)
MCH: 29.9 pg (ref 26.0–34.0)
MCHC: 33.5 g/dL (ref 30.0–36.0)
MCHC: 32.8 g/dL (ref 30.0–36.0)
MCV: 90.8 fL (ref 80.0–100.0)
MCV: 91.2 fL (ref 80.0–100.0)
Monocytes Absolute: 1.9 10*3/uL — ABNORMAL HIGH (ref 0.1–1.0)
Monocytes Absolute: 2.3 10*3/uL — ABNORMAL HIGH (ref 0.1–1.0)
Monocytes Relative: 10 %
Monocytes Relative: 12 %
Neutro Abs: 14.9 10*3/uL — ABNORMAL HIGH (ref 1.7–7.7)
Neutro Abs: 14.3 10*3/uL — ABNORMAL HIGH (ref 1.7–7.7)
Neutrophils Relative %: 84 %
Neutrophils Relative %: 77 %
Platelets: 248 10*3/uL (ref 150–400)
Platelets: 217 10*3/uL (ref 150–400)
RBC: 4.11 MIL/uL (ref 3.87–5.11)
RBC: 3.51 MIL/uL — ABNORMAL LOW (ref 3.87–5.11)
RDW: 13.9 % (ref 11.5–15.5)
RDW: 14 % (ref 11.5–15.5)
WBC: 17.9 10*3/uL — ABNORMAL HIGH (ref 4.0–10.5)
WBC: 18.5 10*3/uL — ABNORMAL HIGH (ref 4.0–10.5)
nRBC: 0 % (ref 0.0–0.2)
nRBC: 0 % (ref 0.0–0.2)

## 2021-02-06 LAB — COMPREHENSIVE METABOLIC PANEL
ALT: 13 U/L (ref 0–44)
AST: 13 U/L — ABNORMAL LOW (ref 15–41)
Albumin: 3.2 g/dL — ABNORMAL LOW (ref 3.5–5.0)
Alkaline Phosphatase: 106 U/L (ref 38–126)
Anion gap: 9 (ref 5–15)
BUN: 8 mg/dL (ref 6–20)
CO2: 21 mmol/L — ABNORMAL LOW (ref 22–32)
Calcium: 8.6 mg/dL — ABNORMAL LOW (ref 8.9–10.3)
Chloride: 107 mmol/L (ref 98–111)
Creatinine, Ser: 0.63 mg/dL (ref 0.44–1.00)
GFR, Estimated: >60 mL/min (ref >60)
Glucose, Bld: 119 mg/dL — ABNORMAL HIGH (ref 70–99)
Potassium: 2.9 mmol/L — ABNORMAL LOW (ref 3.5–5.1)
Sodium: 137 mmol/L (ref 135–145)
Total Bilirubin: 0.7 mg/dL (ref 0.3–1.2)
Total Protein: 6.3 g/dL — ABNORMAL LOW (ref 6.5–8.1)

## 2021-02-06 LAB — URINALYSIS, MICROSCOPIC (REFLEX)

## 2021-02-06 LAB — LACTIC ACID, PLASMA: Lactic Acid, Venous: 0.8 mmol/L (ref 0.5–1.9)

## 2021-02-06 LAB — URINALYSIS, ROUTINE W REFLEX MICROSCOPIC
Bilirubin Urine: NEGATIVE
Glucose, UA: NEGATIVE mg/dL
Ketones, ur: NEGATIVE mg/dL
Nitrite: NEGATIVE
Protein, ur: NEGATIVE mg/dL
Specific Gravity, Urine: 1.015 (ref 1.005–1.030)
pH: 6.5 (ref 5.0–8.0)

## 2021-02-06 LAB — PROCALCITONIN: Procalcitonin: 0.61 ng/mL

## 2021-02-06 LAB — RESP PANEL BY RT-PCR (FLU A&B, COVID) ARPGX2
Influenza A by PCR: NEGATIVE
Influenza B by PCR: NEGATIVE
SARS Coronavirus 2 by RT PCR: POSITIVE — AB

## 2021-02-06 LAB — ABO/RH: ABO/RH(D): O POS

## 2021-02-06 LAB — HEMOGLOBIN A1C
Hgb A1c MFr Bld: 5.1 % (ref 4.8–5.6)
Mean Plasma Glucose: 99.67 mg/dL

## 2021-02-06 LAB — TROPONIN I (HIGH SENSITIVITY)
Troponin I (High Sensitivity): 7 ng/L (ref <18)
Troponin I (High Sensitivity): 5 ng/L (ref <18)

## 2021-02-06 LAB — MAGNESIUM: Magnesium: 1.6 mg/dL — ABNORMAL LOW (ref 1.7–2.4)

## 2021-02-06 MED ORDER — ONDANSETRON HCL 4 MG PO TABS: 4.0000 mg | ORAL_TABLET | Freq: Four times a day (QID) | ORAL | Status: DC | PRN

## 2021-02-06 MED ORDER — MAGNESIUM SULFATE 2 GM/50ML IV SOLN
2.0000 g | Freq: Once | INTRAVENOUS | Status: AC
  Administered 2021-02-06: 2 g via INTRAVENOUS
  Filled 2021-02-06: qty 50

## 2021-02-06 MED ORDER — POTASSIUM CHLORIDE CRYS ER 20 MEQ PO TBCR
20.0000 meq | EXTENDED_RELEASE_TABLET | Freq: Once | ORAL | Status: AC
  Administered 2021-02-06: 20 meq via ORAL
  Filled 2021-02-06: qty 1

## 2021-02-06 MED ORDER — ACETAMINOPHEN 325 MG PO TABS
650.0000 mg | ORAL_TABLET | Freq: Once | ORAL | Status: AC
Start: 2021-02-06 — End: 2021-02-06
  Administered 2021-02-06: 650 mg via ORAL
  Filled 2021-02-06: qty 2

## 2021-02-06 MED ORDER — ENOXAPARIN SODIUM 40 MG/0.4ML IJ SOSY
40.0000 mg | PREFILLED_SYRINGE | INTRAMUSCULAR | Status: DC
  Administered 2021-02-06 – 2021-02-07 (×2): 40 mg via SUBCUTANEOUS
  Filled 2021-02-06 (×3): qty 0.4

## 2021-02-06 MED ORDER — SODIUM CHLORIDE 0.9 % IV BOLUS
1000.0000 mL | Freq: Once | INTRAVENOUS | Status: AC
  Administered 2021-02-06: 1000 mL via INTRAVENOUS

## 2021-02-06 MED ORDER — SODIUM CHLORIDE 0.9 % IV SOLN
2.0000 g | INTRAVENOUS | Status: DC
  Administered 2021-02-07 – 2021-02-08 (×2): 2 g via INTRAVENOUS
  Filled 2021-02-06 (×2): qty 20

## 2021-02-06 MED ORDER — HYDROCODONE-ACETAMINOPHEN 5-325 MG PO TABS
1.0000 | ORAL_TABLET | ORAL | Status: DC | PRN
  Administered 2021-02-07 (×2): 2 via ORAL
  Administered 2021-02-07 – 2021-02-08 (×3): 1 via ORAL
  Administered 2021-02-08 (×2): 2 via ORAL
  Filled 2021-02-06: qty 1
  Filled 2021-02-06: qty 2
  Filled 2021-02-06 (×2): qty 1
  Filled 2021-02-06 (×5): qty 2

## 2021-02-06 MED ORDER — SODIUM CHLORIDE 0.9 % IV SOLN
1.0000 g | Freq: Once | INTRAVENOUS | Status: AC
  Administered 2021-02-06: 1 g via INTRAVENOUS
  Filled 2021-02-06: qty 10

## 2021-02-06 MED ORDER — LACTATED RINGERS IV BOLUS
1000.0000 mL | Freq: Once | INTRAVENOUS | Status: AC
  Administered 2021-02-06: 1000 mL via INTRAVENOUS

## 2021-02-06 MED ORDER — ONDANSETRON HCL 4 MG/2ML IJ SOLN: 4.0000 mg | Freq: Four times a day (QID) | INTRAMUSCULAR | Status: DC | PRN

## 2021-02-06 MED ORDER — ACETAMINOPHEN 325 MG PO TABS
650.0000 mg | ORAL_TABLET | Freq: Four times a day (QID) | ORAL | Status: DC | PRN
  Administered 2021-02-07 – 2021-02-09 (×3): 650 mg via ORAL
  Filled 2021-02-06 (×4): qty 2

## 2021-02-06 MED ORDER — KETOROLAC TROMETHAMINE 15 MG/ML IJ SOLN
15.0000 mg | Freq: Once | INTRAMUSCULAR | Status: AC
  Administered 2021-02-06: 15 mg via INTRAVENOUS
  Filled 2021-02-06: qty 1

## 2021-02-06 MED ORDER — INSULIN ASPART 100 UNIT/ML IJ SOLN: 0.0000 [IU] | INTRAMUSCULAR | Status: DC

## 2021-02-06 MED ORDER — SODIUM CHLORIDE 0.9% FLUSH
3.0000 mL | Freq: Two times a day (BID) | INTRAVENOUS | Status: DC
  Administered 2021-02-07 – 2021-02-09 (×5): 3 mL via INTRAVENOUS

## 2021-02-06 MED ORDER — GUAIFENESIN-DM 100-10 MG/5ML PO SYRP
10.0000 mL | ORAL_SOLUTION | ORAL | Status: DC | PRN
  Filled 2021-02-06: qty 10

## 2021-02-06 MED ORDER — IOHEXOL 350 MG/ML SOLN
100.0000 mL | Freq: Once | INTRAVENOUS | Status: AC | PRN
  Administered 2021-02-06: 100 mL via INTRAVENOUS

## 2021-02-06 MED ORDER — POTASSIUM CHLORIDE 10 MEQ/100ML IV SOLN
10.0000 meq | INTRAVENOUS | Status: AC
  Administered 2021-02-06 (×2): 10 meq via INTRAVENOUS
  Filled 2021-02-06 (×2): qty 100

## 2021-02-06 MED ORDER — POTASSIUM CHLORIDE 10 MEQ/100ML IV SOLN
10.0000 meq | INTRAVENOUS | Status: AC
  Administered 2021-02-06 – 2021-02-07 (×2): 10 meq via INTRAVENOUS
  Filled 2021-02-06 (×2): qty 100

## 2021-02-06 MED ORDER — ONDANSETRON HCL 4 MG/2ML IJ SOLN
4.0000 mg | Freq: Once | INTRAMUSCULAR | Status: AC
  Administered 2021-02-06: 4 mg via INTRAVENOUS
  Filled 2021-02-06: qty 2

## 2021-02-06 NOTE — H&P (Signed)
Amber Maldonado LNZ:[id number] DOB: 07-16-1960 DOA: 02/06/2021     PCP: [name], MD   Outpatient Specialists: * NONE CARDS:   Dr.Mahesh Chandrasekhar     Patient arrived to ER on 02/06/21 at 1401 Referred by Attending Toy Baker, MD   Patient coming from: home Lives alone,      Chief Complaint:   Chief Complaint  Patient presents with   Generalized Body Aches   Dizziness    HPI: Amber Maldonado is a 61 y.o. female with medical history significant of  fibromyalgia, HTN, SVT CKD, fibromyalgia  Presented with chills, feeling unwell Complaining of body aches since last night, she was diagnosed with COVID 3 wks ago was treated with Paxlovid yesterday symptoms recurred, At home Covid test was negative and turned positive again on arrival Feeling very light headed like she is going to pass out Recently was treated for UTI with Macrobid Symptoms improved but not resolved No fever at home   Pt also was given a cource of amoxicilin for URI and steroids Has  been vaccinated against COVID  and boosted    Initial COVID TEST   POSITIVE      Lab Results  Component Value Date   [place] (A) 02/06/2021   [place] NEGATIVE 01/01/2020     Regarding pertinent Chronic problems:      HTN on lisinopril  Hx of SVT - on Diltiazem       DM 2 - No results found for: HGBA1C  on PO meds only,        While in ER:   UA looked dirty and CT showed evidence of pyelo, pt meeting SEPSIS criteria    Ordered  CT HEAD   NON acute  CXR -  NON acute  CTabd/pelvis -  Right striated nephrogram suggestive of pyelonephritis A 1.9 cm hypodense right thyroid gland nodule. Recommend thyroid US hepatomegaly CTA chest -  nonacute, no PE, no evidence of infiltrate   Following Medications were ordered in ER: Medications  potassium chloride 10 mEq in 100 mL IVPB (10 mEq Intravenous New Bag/Given 02/06/21 2105)  magnesium sulfate IVPB 2 g 50 mL (2 g Intravenous New  Bag/Given 02/06/21 2129)  lactated ringers bolus 1,000 mL (1,000 mLs Intravenous New Bag/Given 02/06/21 1808)  acetaminophen (TYLENOL) tablet 650 mg (650 mg Oral Given 02/06/21 1800)  ondansetron (ZOFRAN) injection 4 mg (4 mg Intravenous Given 02/06/21 1809)  potassium chloride SA (KLOR-CON M) CR tablet 20 mEq (20 mEq Oral Given 02/06/21 1800)  sodium chloride 0.9 % bolus 1,000 mL (0 mLs Intravenous Stopped 02/06/21 1930)  cefTRIAXone (ROCEPHIN) 1 g in sodium chloride 0.9 % 100 mL IVPB (0 g Intravenous Stopped 02/06/21 2001)  ketorolac (TORADOL) 15 MG/ML injection 15 mg (15 mg Intravenous Given 02/06/21 1942)  iohexol (OMNIPAQUE) 350 MG/ML injection 100 mL (100 mLs Intravenous Contrast Given 02/06/21 2038)       ED Triage Vitals  Enc Vitals Group     BP 02/06/21 1408 134/88     Pulse Rate 02/06/21 1408 (!) 132     Resp 02/06/21 1408 14     Temp 02/06/21 1408 97.9 F (36.6 C)     Temp Source 02/06/21 1933 Oral     SpO2 02/06/21 1408 97 %     Weight 02/06/21 1448 157 lb (71.2 kg)     Height 02/06/21 1448 5' (1.524 m)     Head Circumference --      Peak Flow --  Pain Score 02/06/21 1447 8     Pain Loc --      Pain Edu? --      Excl. in [place]? --   TMAX(24)@     _________________________________________ Significant initial  Findings: Abnormal Labs Reviewed  RESP PANEL BY RT-PCR (FLU A&B, COVID) ARPGX2 - Abnormal; Notable for the following components:      Result Value   SARS Coronavirus 2 by RT PCR POSITIVE (*)    All other components within normal limits  COMPREHENSIVE METABOLIC PANEL - Abnormal; Notable for the following components:   Potassium 2.9 (*)    CO2 21 (*)    Glucose, Bld 119 (*)    Calcium 8.6 (*)    Total Protein 6.3 (*)    Albumin 3.2 (*)    AST 13 (*)    All other components within normal limits  CBC WITH DIFFERENTIAL/PLATELET - Abnormal; Notable for the following components:   WBC 17.9 (*)    Neutro Abs 14.9 (*)    Monocytes Absolute 1.9 (*)    Abs Immature Granulocytes  0.18 (*)    All other components within normal limits  URINALYSIS, ROUTINE W REFLEX MICROSCOPIC - Abnormal; Notable for the following components:   Hgb urine dipstick SMALL (*)    Leukocytes,Ua SMALL (*)    All other components within normal limits  MAGNESIUM - Abnormal; Notable for the following components:   Magnesium 1.6 (*)    All other components within normal limits  URINALYSIS, MICROSCOPIC (REFLEX) - Abnormal; Notable for the following components:   Bacteria, UA FEW (*)    All other components within normal limits     _________________________ Troponin 7 ECG: Ordered Personally reviewed by me showing: HR : 111 Rhythm: Sinus tachycardia    nonspecific changes   QTC 427  28.1 ____________________ This patient meets SIRS Criteria and may be septic.    The recent clinical data is shown below. Vitals:   02/06/21 1800 02/06/21 1915 02/06/21 1933 02/06/21 2115  BP: 127/86 103/69  105/74  Pulse: (!) 105 (!) 110  94  Resp: (!) 22 (!) 23  14  Temp:   99.1 F (37.3 C)   TempSrc:   Oral   SpO2: 99% 98%  98%  Weight:      Height:          WBC     Component Value Date/Time   WBC 17.9 (H) 02/06/2021 1553   LYMPHSABS 1.0 02/06/2021 1553   MONOABS 1.9 (H) 02/06/2021 1553   EOSABS 0.0 02/06/2021 1553   BASOSABS 0.1 02/06/2021 1553    Lactic Acid, Venous    Component Value Date/Time   LATICACIDVEN 0.8 02/06/2021 1553    Procalcitonin 0.61      UA  evidence of UTI     Urine analysis:    Component Value Date/Time   COLORURINE YELLOW 02/06/2021 [place] 02/06/2021 1544   LABSPEC 1.015 02/06/2021 1544   PHURINE 6.5 02/06/2021 1544   GLUCOSEU NEGATIVE 02/06/2021 1544   HGBUR SMALL (A) 02/06/2021 1544   BILIRUBINUR NEGATIVE 02/06/2021 1544   KETONESUR NEGATIVE 02/06/2021 1544   PROTEINUR NEGATIVE 02/06/2021 1544   NITRITE NEGATIVE 02/06/2021 1544   LEUKOCYTESUR SMALL (A) 02/06/2021 1544    Results for orders placed or performed during the  hospital encounter of 02/06/21  Resp Panel by RT-PCR (Flu A&B, Covid) Nasopharyngeal Swab     Status: Abnormal   Collection Time: 02/06/21  3:44 PM   Specimen: Nasopharyngeal  Swab; Nasopharyngeal(NP) swabs in vial transport medium  Result Value Ref Range Status   SARS Coronavirus 2 by RT PCR POSITIVE (A) NEGATIVE Final         Influenza A by PCR NEGATIVE NEGATIVE Final   Influenza B by PCR NEGATIVE NEGATIVE Final          _______________________________________________ Hospitalist was called for admission for COVID and pyelonephritis  The following Work up has been ordered so far:  Orders Placed This Encounter  Procedures   Resp Panel by RT-PCR (Flu A&B, Covid) Nasopharyngeal Swab   DG Chest 2 View   CT ABDOMEN PELVIS W CONTRAST   CT Angio Chest PE W and/or Wo Contrast   CT HEAD WO CONTRAST (5MM)   Comprehensive metabolic panel   CBC with Differential   Urinalysis, Routine w reflex microscopic   Magnesium   Urinalysis, Microscopic (reflex)   Re-check Vital Signs   Cardiac monitoring   Consult for [facility] Medical Admission   EKG 12-Lead   ED EKG   Place in observation (patient's expected length of stay will be less than 2 midnights)     OTHER Significant initial  Findings:  labs showing:    Recent Labs  Lab 02/06/21 1553 02/06/21 1740  NA 137  --   K 2.9*  --   CO2 21*  --   GLUCOSE 119*  --   BUN 8  --   CREATININE 0.63  --   CALCIUM 8.6*  --   MG  --  1.6*    Cr  stable,    Lab Results  Component Value Date   CREATININE 0.63 02/06/2021   CREATININE 0.81 01/01/2020    Recent Labs  Lab 02/06/21 1553  AST 13*  ALT 13  ALKPHOS 106  BILITOT 0.7  PROT 6.3*  ALBUMIN 3.2*   Lab Results  Component Value Date   CALCIUM 8.6 (L) 02/06/2021    Plt: Lab Results  Component Value Date   PLT 248 02/06/2021    COVID-19 Labs  No results for input(s): DDIMER, FERRITIN, LDH, CRP in the last 72 hours.  Lab Results  Component Value Date   SARSCOV2NAA  POSITIVE (A) 02/06/2021   [place] NEGATIVE 01/01/2020    Recent Labs  Lab 02/06/21 1553 02/06/21 2202  WBC 17.9* 18.5*  NEUTROABS 14.9* 14.3*  HGB 12.5 10.5*  HCT 37.3 32.0*  MCV 90.8 91.2  PLT 248 217    HG/HCT  stable     Component Value Date/Time   HGB 12.5 02/06/2021 1553   HCT 37.3 02/06/2021 1553   MCV 90.8 02/06/2021 1553     DM  labs:  HbA1C: No results for input(s): HGBA1C in the last 8760 hours.     CBG (last 3)  No results for input(s): GLUCAP in the last 72 hours.     Cultures: No results found for: SDES, [place], CULT, REPTSTATUS   Radiological Exams on Admission: DG Chest 2 View  Result Date: 02/06/2021 CLINICAL DATA:  Body aches, dizziness. EXAM: CHEST - 2 VIEW COMPARISON:  None. FINDINGS: The heart size and mediastinal contours are within normal limits. Both lungs are clear. The visualized skeletal structures are unremarkable. IMPRESSION: No active cardiopulmonary disease. Electronically Signed   By: Marijo Conception M.D.   On: 02/06/2021 16:42   CT HEAD WO CONTRAST (5MM)  Result Date: 02/06/2021 CLINICAL DATA:  Headache, chronic, new features or increased frequency EXAM: CT HEAD WITHOUT CONTRAST TECHNIQUE: Contiguous axial images were obtained from the  base of the skull through the vertex without intravenous contrast. RADIATION DOSE REDUCTION: This exam was performed according to the departmental dose-optimization program which includes automated exposure control, adjustment of the mA and/or kV according to patient size and/or use of iterative reconstruction technique. COMPARISON:  None. FINDINGS: Brain: No evidence of large-territorial acute infarction. No parenchymal hemorrhage. No mass lesion. No extra-axial collection. No mass effect or midline shift. No hydrocephalus. Basilar cisterns are patent. Vascular: No hyperdense vessel. Atherosclerotic calcifications are present within the cavernous internal carotid arteries. Skull: No acute fracture or  focal lesion. Sinuses/Orbits: Paranasal sinuses and mastoid air cells are clear. The orbits are unremarkable. Other: None. IMPRESSION: Negative for acute traumatic injury. Electronically Signed   By: Iven Finn M.D.   On: 02/06/2021 20:59   CT Angio Chest PE W and/or Wo Contrast  Result Date: 02/06/2021 CLINICAL DATA:  Flank pain, kidney stone suspected Nausea/vomiting; Pulmonary embolism (PE) suspected, high prob EXAM: CT ANGIOGRAPHY CHEST CT ABDOMEN AND PELVIS WITH CONTRAST TECHNIQUE: Multidetector CT imaging of the chest was performed using the standard protocol during bolus administration of intravenous contrast. Multiplanar CT image reconstructions and MIPs were obtained to evaluate the vascular anatomy. Multidetector CT imaging of the abdomen and pelvis was performed using the standard protocol during bolus administration of intravenous contrast. RADIATION DOSE REDUCTION: This exam was performed according to the departmental dose-optimization program which includes automated exposure control, adjustment of the mA and/or kV according to patient size and/or use of iterative reconstruction technique. CONTRAST:  174m OMNIPAQUE IOHEXOL 350 MG/ML SOLN COMPARISON:  CT abdomen pelvis 12/31/2017 FINDINGS: CTA CHEST FINDINGS Cardiovascular: Satisfactory opacification of the pulmonary arteries to the segmental level. No evidence of pulmonary embolism. Normal heart size. No significant pericardial effusion. The thoracic aorta is normal in caliber. No atherosclerotic plaque of the thoracic aorta. At least 1 vessel coronary artery calcifications. Mediastinum/Nodes: No enlarged mediastinal, hilar, or axillary lymph nodes. Trachea and esophagus demonstrate no significant findings. There is a 1.9 cm hypodense right thyroid gland nodule. Lungs/Pleura: Bilateral lower lobe subsegmental atelectasis. No focal consolidation. No pulmonary nodule. No pulmonary mass. No pleural effusion. No pneumothorax. Musculoskeletal: No  chest wall abnormality. No suspicious lytic or blastic osseous lesions. No acute displaced fracture. Review of the MIP images confirms the above findings. CT ABDOMEN and PELVIS FINDINGS Hepatobiliary: The liver is enlarged measuring up to 19 cm. No focal liver abnormality. No gallstones, gallbladder wall thickening, or pericholecystic fluid. No biliary dilatation. Pancreas: No focal lesion. Normal pancreatic contour. No surrounding inflammatory changes. No main pancreatic ductal dilatation. Spleen: Normal in size without focal abnormality. Adrenals/Urinary Tract: No adrenal nodule bilaterally. Striated right nephrogram. The left kidney enhances homogeneously. No abscess formation. No nephroureterolithiasis bilaterally. No hydronephrosis. No hydroureter. The urinary bladder is unremarkable. On delayed imaging, there is no urothelial wall thickening and there are no filling defects in the opacified portions of the bilateral collecting systems or ureters. Stomach/Bowel: Stomach is within normal limits. No evidence of bowel wall thickening or dilatation. Scattered colonic diverticulosis. The appendix is not definitely identified with no inflammatory changes in the right lower quadrant to suggest acute appendicitis. Vascular/Lymphatic: No abdominal aorta or iliac aneurysm. Mild atherosclerotic plaque of the aorta and its branches. No abdominal, pelvic, or inguinal lymphadenopathy. Reproductive: Status post hysterectomy. No adnexal masses. Other: No intraperitoneal free fluid. No intraperitoneal free gas. No organized fluid collection. Musculoskeletal: No abdominal wall hernia or abnormality. No suspicious lytic or blastic osseous lesions. No acute displaced fracture. Review of the MIP  images confirms the above findings. IMPRESSION: 1. No pulmonary embolus. 2. No acute intrapulmonary abnormality. 3. Right striated nephrogram suggestive of pyelonephritis. No definite abscess formation. Correlate with urinalysis. 4. A 1.9  cm hypodense right thyroid gland nodule. Recommend thyroid US (ref: J Am Coll Radiol. 2015 Feb;12(2): 143-50). 5. Colonic diverticulosis with no acute diverticulitis. 6. Hepatomegaly. Electronically Signed   By: Iven Finn M.D.   On: 02/06/2021 20:48   CT ABDOMEN PELVIS W CONTRAST  Result Date: 02/06/2021 CLINICAL DATA:  Flank pain, kidney stone suspected Nausea/vomiting; Pulmonary embolism (PE) suspected, high prob EXAM: CT ANGIOGRAPHY CHEST CT ABDOMEN AND PELVIS WITH CONTRAST TECHNIQUE: Multidetector CT imaging of the chest was performed using the standard protocol during bolus administration of intravenous contrast. Multiplanar CT image reconstructions and MIPs were obtained to evaluate the vascular anatomy. Multidetector CT imaging of the abdomen and pelvis was performed using the standard protocol during bolus administration of intravenous contrast. RADIATION DOSE REDUCTION: This exam was performed according to the departmental dose-optimization program which includes automated exposure control, adjustment of the mA and/or kV according to patient size and/or use of iterative reconstruction technique. CONTRAST:  144m OMNIPAQUE IOHEXOL 350 MG/ML SOLN COMPARISON:  CT abdomen pelvis 12/31/2017 FINDINGS: CTA CHEST FINDINGS Cardiovascular: Satisfactory opacification of the pulmonary arteries to the segmental level. No evidence of pulmonary embolism. Normal heart size. No significant pericardial effusion. The thoracic aorta is normal in caliber. No atherosclerotic plaque of the thoracic aorta. At least 1 vessel coronary artery calcifications. Mediastinum/Nodes: No enlarged mediastinal, hilar, or axillary lymph nodes. Trachea and esophagus demonstrate no significant findings. There is a 1.9 cm hypodense right thyroid gland nodule. Lungs/Pleura: Bilateral lower lobe subsegmental atelectasis. No focal consolidation. No pulmonary nodule. No pulmonary mass. No pleural effusion. No pneumothorax. Musculoskeletal: No  chest wall abnormality. No suspicious lytic or blastic osseous lesions. No acute displaced fracture. Review of the MIP images confirms the above findings. CT ABDOMEN and PELVIS FINDINGS Hepatobiliary: The liver is enlarged measuring up to 19 cm. No focal liver abnormality. No gallstones, gallbladder wall thickening, or pericholecystic fluid. No biliary dilatation. Pancreas: No focal lesion. Normal pancreatic contour. No surrounding inflammatory changes. No main pancreatic ductal dilatation. Spleen: Normal in size without focal abnormality. Adrenals/Urinary Tract: No adrenal nodule bilaterally. Striated right nephrogram. The left kidney enhances homogeneously. No abscess formation. No nephroureterolithiasis bilaterally. No hydronephrosis. No hydroureter. The urinary bladder is unremarkable. On delayed imaging, there is no urothelial wall thickening and there are no filling defects in the opacified portions of the bilateral collecting systems or ureters. Stomach/Bowel: Stomach is within normal limits. No evidence of bowel wall thickening or dilatation. Scattered colonic diverticulosis. The appendix is not definitely identified with no inflammatory changes in the right lower quadrant to suggest acute appendicitis. Vascular/Lymphatic: No abdominal aorta or iliac aneurysm. Mild atherosclerotic plaque of the aorta and its branches. No abdominal, pelvic, or inguinal lymphadenopathy. Reproductive: Status post hysterectomy. No adnexal masses. Other: No intraperitoneal free fluid. No intraperitoneal free gas. No organized fluid collection. Musculoskeletal: No abdominal wall hernia or abnormality. No suspicious lytic or blastic osseous lesions. No acute displaced fracture. Review of the MIP images confirms the above findings. IMPRESSION: 1. No pulmonary embolus. 2. No acute intrapulmonary abnormality. 3. Right striated nephrogram suggestive of pyelonephritis. No definite abscess formation. Correlate with urinalysis. 4. A 1.9  cm hypodense right thyroid gland nodule. Recommend thyroid [place](ref: J Am Coll Radiol. 2015 Feb;12(2): 143-50). 5. Colonic diverticulosis with no acute diverticulitis. 6. Hepatomegaly. Electronically Signed  By: Iven Finn M.D.   On: 02/06/2021 20:48   _______________________________________________________________________________________________________ Latest  Blood pressure 105/74, pulse 94, temperature 99.1 F (37.3 C), temperature source Oral, resp. rate 14, height 5' (1.524 m), weight 71.2 kg, SpO2 98 %.   Vitals  labs and radiology finding personally reviewed  Review of Systems:    Pertinent positives include:  Fevers, chills, fatigue,   Constitutional:  No weight loss, night sweats, weight loss  HEENT:  No headaches, Difficulty swallowing,Tooth/dental problems,Sore throat,  No sneezing, itching, ear ache, nasal congestion, post nasal drip,  Cardio-vascular:  No chest pain, Orthopnea, PND, anasarca, dizziness, palpitations.no Bilateral lower extremity swelling  GI:  No heartburn, indigestion, abdominal pain, nausea, vomiting, diarrhea, change in bowel habits, loss of appetite, melena, blood in stool, hematemesis Resp:  no shortness of breath at rest. No dyspnea on exertion, No excess mucus, no productive cough, No non-productive cough, No coughing up of blood.No change in color of mucus.No wheezing. Skin:  no rash or lesions. No jaundice GU:  no dysuria, change in color of urine, no urgency or frequency. No straining to urinate.  No flank pain.  Musculoskeletal:  No joint pain or no joint swelling. No decreased range of motion. No back pain.  Psych:  No change in mood or affect. No depression or anxiety. No memory loss.  Neuro: no localizing neurological complaints, no tingling, no weakness, no double vision, no gait abnormality, no slurred speech, no confusion  All systems reviewed and apart from [place] all are  negative _______________________________________________________________________________________________ Past Medical History:   Past Medical History:  Diagnosis Date   Hypertension    Irritable bowel disease    SVT (supraventricular tachycardia) ([place])     Past Surgical History:  Procedure Laterality Date   KNEE ARTHROPLASTY      Social History:  Ambulatory   independently      reports that she has quit smoking. She has never used smokeless tobacco. She reports current alcohol use. She reports that she does not use drugs.   Family History:   History reviewed. No pertinent family history. ______________________________________________________________________________________________ Allergies: Allergies  Allergen Reactions   Triamcinolone Other (See Comments)    Burning and redness starting at face and traveling down body INJECTION     Prior to Admission medications   Medication Sig Start Date End Date Taking? Authorizing Provider  acetaminophen (TYLENOL) 650 MG CR tablet Take 1,300 mg by mouth in the morning and at bedtime.   Yes [provider]  amoxicillin (AMOXIL) 500 MG capsule Take 2,000 mg by mouth See admin instructions. Take four capsules (2000 mg) by mouth one hour before dental appointments 08/04/19  Yes [provider]  Ascorbic Acid (VITAMIN C) 1000 MG tablet Take 1,000 mg by mouth every evening.   Yes [provider]  ASPIRIN LOW DOSE 81 MG EC tablet Take 81 mg by mouth daily. 01/22/21  Yes [provider]  Calcium-Vitamin D 600-5 MG-MCG TABS Take 1 tablet by mouth in the morning and at bedtime.   Yes [provider]  celecoxib (CELEBREX) 200 MG capsule Take 200 mg by mouth daily. 12/23/20  Yes [provider]  cholecalciferol (VITAMIN D) 25 MCG (1000 UNIT) tablet Take 3,000 Units by mouth every evening.   Yes [provider]  cyanocobalamin (,VITAMIN B-12,) 1000 MCG/ML injection Inject 1,000 mcg into  the muscle every 30 (thirty) days.   Yes [provider]  Docusate Sodium 100 MG capsule Take 100 mg by mouth 2 (  two) times daily.   Yes [provider]  FIBER ADULT GUMMIES PO Take 1 tablet by mouth every evening.   Yes [provider]  ibuprofen (ADVIL) 800 MG tablet Take 800 mg by mouth every 8 (eight) hours as needed for moderate pain. 01/22/21  Yes [provider]  lisinopril (ZESTRIL) 5 MG tablet Take 5 mg by mouth daily. 12/15/19  Yes [provider]  Magnesium 400 MG TABS Take 800 mg by mouth daily.   Yes [provider]  Nutritional Supplements (ESTROVEN PO) Take 1 tablet by mouth daily.   Yes [provider]  pantoprazole (PROTONIX) 40 MG tablet Take 40 mg by mouth daily. 09/19/19  Yes [provider]  promethazine-dextromethorphan (PROMETHAZINE-DM) 6.25-15 MG/5ML syrup Take 5 mLs by mouth 4 (four) times daily as needed for cough. 01/22/21  Yes [provider]  vitamin B-12 (CYANOCOBALAMIN) 1000 MCG tablet Take 1,000 mcg by mouth daily.   Yes [provider]  Zinc 50 MG TABS Take 50 mg by mouth daily.   Yes [provider]  diltiazem (CARDIZEM) 30 MG tablet TAKE 1 TABLET(30 MG) BY MOUTH FOUR TIMES DAILY AS NEEDED FOR FAST HEART RATE Patient not taking: Reported on 02/06/2021 02/27/20   Werner Lean, MD    ___________________________________________________________________________________________________ Physical Exam: Vitals with BMI 02/06/2021 02/06/2021 02/06/2021  Height - - -  Weight - - -  BMI - - -  Systolic 299 242 683  Diastolic 74 69 86  Pulse 94 110 105     1. General:  in No  Acute distress   Chronically ill  -appearing 2. Psychological: Alert and   Oriented 3. Head/ENT: Dry Mucous Membranes                          Head Non traumatic, neck supple                          Poor Dentition 4. SKIN: decreased Skin turgor,  Skin clean Dry and intact no rash 5. Heart:  Regular rate and rhythm no  Murmur, no Rub or gallop 6. Lungs:  no wheezes or crackles   7. Abdomen: Soft,  non-tender, Non distended  bowel sounds present 8. Lower extremities: no clubbing, cyanosis, no  edema 9. Neurologically Grossly intact, moving all 4 extremities equally   10. MSK: Normal range of motion    Chart has been reviewed  ______________________________________________________________________________________________  Assessment/Plan 61 y.o. female with medical history significant of  fibromyalgia, HTN, SVT CKD, fibromyalgia    Admitted for UTI , hypokalemia, hypomagnesemia, presyncope, possible rebound covid infection   Present on Admission:  COVID-19 virus infection  Essential hypertension  Hypomagnesemia  Hypokalemia  Acute pyelonephritis  Sepsis ([place])    Hypomagnesemia Will replace and recheck  Essential hypertension Allow permissive HTn   Hypokalemia - will replace and repeat in AM,  check magnesium level and replace as needed   Acute pyelonephritis  - treat with Rocephin       await results of urine culture and adjust antibiotic coverage as needed   Sepsis ([place])  -SIRS criteria met with  elevated white blood cell count,       Component Value Date/Time   WBC 17.9 (H) 02/06/2021 1553   LYMPHSABS 1.0 02/06/2021 1553     tachycardia    fever   RR >20 Today's Vitals   02/06/21 1800 02/06/21 1915 02/06/21 1933 02/06/21  2115  BP: 127/86 103/69  105/74  Pulse: (!) 105 (!) 110  94  Resp: (!) 22 (!) 23  14  Temp:   99.1 F (37.3 C)   TempSrc:   Oral   SpO2: 99% 98%  98%  Weight:      Height:      PainSc:   5        -Most likely source being urinary       - Obtain serial lactic acid and procalcitonin level.  - Initiated IV antibiotics in ER: Antibiotics Given (last 72 hours)     Date/Time Action Medication Dose Rate   02/06/21 1931 New Bag/Given   cefTRIAXone (ROCEPHIN) 1 g in sodium chloride 0.9 % 100 mL IVPB 1 g 200 mL/hr        Will continue      - await results of blood and urine culture  - Rehydrate aggressively  Intravenous fluids were administered      10:09 PM   COVID-19 virus infection COVID infection -incidental finding patient is  vaccinated   boosted  So far mild URI symptoms no evidence of hypoxia CXR with no infiltrates Cycle Threshold >32 (indicates low levels of virus)  Recommend supportive care only  Paxlovid is recommended if Cycle Threshold < 32   Will treat with Ramdesivir  Hold off on steroids noinfiltrates or pulmonary complaints, no hypoxia Obtain inflammatory markers Pronate as needed   Supportive measures Avoid over aggressive fluid resuscitation Airborne precautions   Other plan as per orders.  DVT prophylaxis:  Lovenox      Code Status:    Code Status: Not on file FULL CODE  as per patient   I had personally discussed CODE STATUS with patient    Family Communication:   Family not at  Bedside    Disposition Plan:   To home once workup is complete and patient is stable   Following barriers for discharge:                            Electrolytes corrected                             Consults called: none  Admission status:  ED Disposition     ED Disposition  Admit   Condition  --   [place]: [place] [[id number]]  Level of Care: Telemetry Medical [[id number]]  May place patient in observation at [facility] or [place] if equivalent level of care is available:: No  Covid Evaluation: Confirmed COVID Positive  Diagnosis: COVID-19 virus infection [[id number]]  Admitting Physician: Toy Baker [[id number]]  Attending Physician: Toy Baker [[id number]]           Obs      Level of care     tele  For 12H     Lab Results  Component Value Date   [place] (A) 02/06/2021     Precautions: admitted as   covid positive    Raad Clayson 02/06/2021, 3:13 AM    [facility]     after 2 AM please  page floor coverage PA If 7AM-7PM, please contact the day team taking care of the patient using [email or web address]   Patient was evaluated in the context of the global COVID-19 pandemic, which necessitated consideration that the patient might be at risk for infection with the SARS-CoV-2 virus that causes  COVID-19. Institutional protocols and algorithms that pertain to the evaluation of patients at risk for COVID-19 are in a state of rapid change based on information released by regulatory bodies including the CDC and federal and state organizations. These policies and algorithms were followed during the patient's care.

## 2021-02-06 NOTE — ED Triage Notes (Signed)
Pt arrived POV from home c/o generalized body aches and dizziness that started last night. Pt states she had COVID 3 weeks ago but took a home test that was negative.

## 2021-02-06 NOTE — ED Notes (Signed)
Antibiotics administered prior to blood culture order, no cultures ordered by ED. Dr Roel Cluck aware

## 2021-02-06 NOTE — Subjective & Objective (Addendum)
Complaining of body aches since last night, she was diagnosed with COVID 3 wks ago was treated with Paxlovid yesterday symptoms recurred severe muscle aches and pain  At home Covid test was negative and turned positive again on arrival Feeling very light headed like she is going to pass out Very fatigued no CP  Recently was treated for UTI with Macrobid Symptoms improved but not resolved No fever at home

## 2021-02-06 NOTE — Assessment & Plan Note (Signed)
-  SIRS criteria met with  elevated white blood cell count,       Component Value Date/Time   WBC 17.9 (H) 02/06/2021 1553   LYMPHSABS 1.0 02/06/2021 1553     tachycardia    fever   RR >20 Today's Vitals   02/06/21 1800 02/06/21 1915 02/06/21 1933 02/06/21 2115  BP: 127/86 103/69  105/74  Pulse: (!) 105 (!) 110  94  Resp: (!) 22 (!) 23  14  Temp:   99.1 F (37.3 C)   TempSrc:   Oral   SpO2: 99% 98%  98%  Weight:      Height:      PainSc:   5        -Most likely source being urinary       - Obtain serial lactic acid and procalcitonin level.  - Initiated IV antibiotics in ER: Antibiotics Given (last 72 hours)    Date/Time Action Medication Dose Rate   02/06/21 1931 New Bag/Given   cefTRIAXone (ROCEPHIN) 1 g in sodium chloride 0.9 % 100 mL IVPB 1 g 200 mL/hr      Will continue      - await results of blood and urine culture  - Rehydrate aggressively  Intravenous fluids were administered      10:09 PM

## 2021-02-06 NOTE — ED Provider Notes (Signed)
[facility] Emergency Department Provider Note MRN:  [id number]  Arrival date & time: 02/06/21     Chief Complaint   Generalized Body Aches and Dizziness   History of Present Illness   Amber Maldonado is a 61 y.o. year-old female with a history of HTN, IBD, SVT presenting to the ED with chief complaint of generalized body aches, dizziness and fatigue.  Patient states that she was recently diagnosed with COVID-19.  She did take baclofen during her COVID-19 infection.  She finished the Paxil that around 2 weeks ago.  She was also treated for UTI recently for which she is still on Macrobid but she continues to have symptoms.  States that she never had dysuria, frequency and urgency.  States that she has had no continued fevers but has had chills.  Has had diffuse myalgias.  Denies that she is having shortness of breath, chest pain, abdominal pain, or lower extremity swelling.  Review of Systems  A thorough review of systems was obtained and all systems are negative except as noted in the HPI and PMH.   Patient's Health History    Past Medical History:  Diagnosis Date   Hypertension    Irritable bowel disease    SVT (supraventricular tachycardia) ([place])     Past Surgical History:  Procedure Laterality Date   KNEE ARTHROPLASTY      History reviewed. No pertinent family history.  Social History   Socioeconomic History   Marital status: Legally Separated    Spouse name: Not on file   Number of children: Not on file   Years of education: Not on file   Highest education level: Not on file  Occupational History   Not on file  Tobacco Use   Smoking status: Former   Smokeless tobacco: Never  Substance and Sexual Activity   Alcohol use: Yes    Comment: occassional   Drug use: Never   Sexual activity: Not Currently  Other Topics Concern   Not on file  Social History Narrative   Not on file   Social Determinants of Health   Financial Resource Strain:  Not on file  Food Insecurity: Not on file  Transportation Needs: Not on file  Physical Activity: Not on file  Stress: Not on file  Social Connections: Not on file  Intimate Partner Violence: Not on file     Physical Exam   Physical Exam Constitutional:      Appearance: She is well-developed. She is ill-appearing.  HENT:     Head: Normocephalic and atraumatic.     Right Ear: External ear normal.     Left Ear: External ear normal.     Nose: Nose normal.  Cardiovascular:     Rate and Rhythm: Regular rhythm. Tachycardia present.     Pulses: Normal pulses.     Heart sounds: Normal heart sounds.  Pulmonary:     Effort: Pulmonary effort is normal. No respiratory distress.     Breath sounds: Normal breath sounds. No wheezing.  Abdominal:     General: Abdomen is flat. There is no distension.     Palpations: Abdomen is soft.     Tenderness: There is no abdominal tenderness.  Musculoskeletal:     Cervical back: Neck supple.     Right lower leg: No edema.     Left lower leg: No edema.  Skin:    General: Skin is warm and dry.  Neurological:     General: No focal deficit present.  Mental Status: She is alert and oriented to person, place, and time.  Psychiatric:        Mood and Affect: Mood normal.      Diagnostic and Interventional Summary    EKG Interpretation  Date/Time:  Thursday February 06 2021 14:41:30 EST Ventricular Rate:  111 PR Interval:  152 QRS Duration: 82 QT Interval:  314 QTC Calculation: 427 R Axis:   75 Text Interpretation: Sinus tachycardia Possible Left atrial enlargement Marked ST abnormality, possible inferior subendocardial injury Abnormal ECG When compared with ECG of 01-Jan-2020 11:48, St depressions new since previous Confirmed by Wandra Arthurs [phone]) on 02/06/2021 5:39:43 PM       Labs Reviewed  RESP PANEL BY RT-PCR (FLU A&B, COVID) ARPGX2 - Abnormal; Notable for the following components:      Result Value   SARS Coronavirus 2 by RT PCR  POSITIVE (*)    All other components within normal limits  COMPREHENSIVE METABOLIC PANEL - Abnormal; Notable for the following components:   Potassium 2.9 (*)    CO2 21 (*)    Glucose, Bld 119 (*)    Calcium 8.6 (*)    Total Protein 6.3 (*)    Albumin 3.2 (*)    AST 13 (*)    All other components within normal limits  CBC WITH DIFFERENTIAL/PLATELET - Abnormal; Notable for the following components:   WBC 17.9 (*)    Neutro Abs 14.9 (*)    Monocytes Absolute 1.9 (*)    Abs Immature Granulocytes 0.18 (*)    All other components within normal limits  URINALYSIS, ROUTINE W REFLEX MICROSCOPIC - Abnormal; Notable for the following components:   Hgb urine dipstick SMALL (*)    Leukocytes,Ua SMALL (*)    All other components within normal limits  MAGNESIUM - Abnormal; Notable for the following components:   Magnesium 1.6 (*)    All other components within normal limits  URINALYSIS, MICROSCOPIC (REFLEX) - Abnormal; Notable for the following components:   Bacteria, UA FEW (*)    All other components within normal limits  CBC WITH DIFFERENTIAL/PLATELET - Abnormal; Notable for the following components:   WBC 18.5 (*)    RBC 3.51 (*)    Hemoglobin 10.5 (*)    HCT 32.0 (*)    Neutro Abs 14.3 (*)    Monocytes Absolute 2.3 (*)    Abs Immature Granulocytes 0.19 (*)    All other components within normal limits  CULTURE, BLOOD (ROUTINE X 2)  CULTURE, BLOOD (ROUTINE X 2)  LACTIC ACID, PLASMA  HEMOGLOBIN A1C  PROCALCITONIN  CBC WITH DIFFERENTIAL/PLATELET  COMPREHENSIVE METABOLIC PANEL  C-REACTIVE PROTEIN  D-DIMER, QUANTITATIVE  FERRITIN  MAGNESIUM  CK  HIV ANTIBODY (ROUTINE TESTING W REFLEX)  C-REACTIVE PROTEIN  FERRITIN  ABO/RH  TROPONIN I (HIGH SENSITIVITY)  TROPONIN I (HIGH SENSITIVITY)    CT ABDOMEN PELVIS W CONTRAST  Final Result    CT Angio Chest PE W and/or Wo Contrast  Final Result    CT HEAD WO CONTRAST (5MM)  Final Result    DG Chest 2 View  Final Result       Medications  sodium chloride flush (NS) 0.9 % injection 3 mL (3 mLs Intravenous Not Given 02/06/21 2303)  guaiFENesin-dextromethorphan (ROBITUSSIN DM) 100-10 MG/5ML syrup 10 mL (has no administration in time range)  acetaminophen (TYLENOL) tablet 650 mg (has no administration in time range)  HYDROcodone-acetaminophen (NORCO/VICODIN) 5-325 MG per tablet 1-2 tablet (has no administration in time range)  ondansetron (  ZOFRAN) tablet 4 mg (has no administration in time range)    Or  ondansetron (ZOFRAN) injection 4 mg (has no administration in time range)  insulin aspart (novoLOG) injection 0-9 Units (has no administration in time range)  enoxaparin (LOVENOX) injection 40 mg (40 mg Subcutaneous Given 02/06/21 2323)  potassium chloride 10 mEq in 100 mL IVPB (10 mEq Intravenous New Bag/Given 02/06/21 2322)  cefTRIAXone (ROCEPHIN) 2 g in sodium chloride 0.9 % 100 mL IVPB (has no administration in time range)  lactated ringers bolus 1,000 mL (0 mLs Intravenous Stopped 02/06/21 1808)  acetaminophen (TYLENOL) tablet 650 mg (650 mg Oral Given 02/06/21 1800)  ondansetron (ZOFRAN) injection 4 mg (4 mg Intravenous Given 02/06/21 1809)  potassium chloride SA (KLOR-CON M) CR tablet 20 mEq (20 mEq Oral Given 02/06/21 1800)  potassium chloride 10 mEq in 100 mL IVPB (10 mEq Intravenous New Bag/Given 02/06/21 2213)  sodium chloride 0.9 % bolus 1,000 mL (0 mLs Intravenous Stopped 02/06/21 1930)  cefTRIAXone (ROCEPHIN) 1 g in sodium chloride 0.9 % 100 mL IVPB (0 g Intravenous Stopped 02/06/21 2001)  magnesium sulfate IVPB 2 g 50 mL (0 g Intravenous Stopped 02/06/21 2241)  ketorolac (TORADOL) 15 MG/ML injection 15 mg (15 mg Intravenous Given 02/06/21 1942)  iohexol (OMNIPAQUE) 350 MG/ML injection 100 mL (100 mLs Intravenous Contrast Given 02/06/21 2038)     Procedures  /  Critical Care Procedures  ED Course and Medical Decision Making  Initial Impression and Ddx 61 year old female presented to emergency department for evaluation of  myalgias, dizziness, fatigue.  Differential diagnosis includes resolving to the following: Pyelonephritis, influenza, pneumonia, other viral infection.  I is concerned this time is pyelonephritis given that the patient is still having symptoms after having recent urinary tract infection diagnosis.  I am concerned that she is now septic from this given that she is tachycardic.  Past medical/surgical history that increases complexity of ED encounter: Recent COVID-19 infection, recent precluded use, recent cystitis with antibiotic use  Interpretation of Diagnostics I personally reviewed the EKG, Chest Xray, and Cardiac Monitor and my interpretation is as follows: EKG did reveal mild ST depressions but no signs of acute ST elevation.  Cardiac monitor revealed the patient was in normal sinus rhythm however she was tachycardic.  Chest x-ray was negative for acute cardiopulmonary process.  Lab work was significant for leukocytosis with neutrophilic predominance.  The patient did have evidence of continued urinary tract infection on UA.  CT abdomen pelvis did reveal evidence of pyelonephritis.  CT head was negative for acute intracranial process.  CT PE study was negative for acute cardioembolic disease.  Patient was also noted to have hypomagnesemic and hypokalemic.  She was replaced with both magnesium and potassium.    Patient was also administered 2 L of fluid bolus and remained mildly tachycardic.  She was then administered Rocephin.  Cultures obtained.  Patient Reassessment and Ultimate Disposition/Management I discussed the patient's care with the admitting hospitalist team and feels that she is reasonable for admission for pyelonephritis.  She was seen and evaluated in our ED by them.  Please see their note for further details.  Patient management required discussion with the following services or consulting groups:  Hospitalist Service  Complexity of Problems Addressed Acute illness or injury  that poses threat of life of bodily function  Additional Data Reviewed and Analyzed Further history obtained from: Recent PCP notes  Factors Impacting ED Encounter Risk Consideration of hospitalization    Final Clinical Impressions(s) / ED  Diagnoses     ICD-10-CM   1. Pyelonephritis  N12          Zachery Dakins, MD 02/06/21 2348    Drenda Freeze, MD 02/13/21 [phone]

## 2021-02-06 NOTE — ED Provider Triage Note (Signed)
Emergency Medicine Provider Triage Evaluation Note  Amber Maldonado , a 61 y.o. female  was evaluated in triage.  Pt complains of since last night she has had generalized body aches, dizziness and fatigue.  Three weeks ago she had covid, had a positive home test.  Today her test was negative, She had gotten better after the covid.    The dizziness is when ever she stands she feels like she may pass out.   She did take paxlovid, home test 6 days after and it was negative.  She finished the paxlovid about two weeks ago.   She was also treated for a UTI, she took macrobid which made her better but she feels like it hasn't gone away.   No fevers at home.    Physical Exam  BP 134/88 (BP Location: Left Arm)    Pulse (!) 132    Temp 97.9 F (36.6 C)    Resp 14    Ht 5' (1.524 m)    Wt 71.2 kg    SpO2 97%    BMI 30.66 kg/m  Gen:   Awake, appears to feel unwell Resp:  Normal effort  MSK:   Moves extremities without difficulty  Other:  Normal speech.   Medical Decision Making  Medically screening exam initiated at 3:38 PM.  Appropriate orders placed.  Amber Maldonado was informed that the remainder of the evaluation will be completed by another provider, this initial triage assessment does not replace that evaluation, and the importance of remaining in the ED until their evaluation is complete.  Sudden onset body aches, headache, fatigue,.    Lorin Glass, [place] 02/06/21 1546

## 2021-02-06 NOTE — Assessment & Plan Note (Signed)
-   treat with Rocephin         await results of urine culture and adjust antibiotic coverage as needed  

## 2021-02-06 NOTE — Assessment & Plan Note (Signed)
Will replace and recheck 

## 2021-02-06 NOTE — Assessment & Plan Note (Signed)
Allow permissive HTn  °

## 2021-02-06 NOTE — Assessment & Plan Note (Signed)
-   will replace and repeat in AM,  check magnesium level and replace as needed ° °

## 2021-02-06 NOTE — ED Notes (Signed)
ED Provider at bedside. 

## 2021-02-07 DIAGNOSIS — B9620 Unspecified Escherichia coli [E. coli] as the cause of diseases classified elsewhere: Secondary | ICD-10-CM | POA: Diagnosis present

## 2021-02-07 DIAGNOSIS — U071 COVID-19: Secondary | ICD-10-CM | POA: Diagnosis present

## 2021-02-07 DIAGNOSIS — Z6830 Body mass index (BMI) 30.0-30.9, adult: Secondary | ICD-10-CM | POA: Diagnosis not present

## 2021-02-07 DIAGNOSIS — Z79899 Other long term (current) drug therapy: Secondary | ICD-10-CM | POA: Diagnosis not present

## 2021-02-07 DIAGNOSIS — M797 Fibromyalgia: Secondary | ICD-10-CM | POA: Diagnosis present

## 2021-02-07 DIAGNOSIS — N10 Acute tubulo-interstitial nephritis: Secondary | ICD-10-CM | POA: Diagnosis present

## 2021-02-07 DIAGNOSIS — E538 Deficiency of other specified B group vitamins: Secondary | ICD-10-CM | POA: Diagnosis present

## 2021-02-07 DIAGNOSIS — K219 Gastro-esophageal reflux disease without esophagitis: Secondary | ICD-10-CM | POA: Diagnosis present

## 2021-02-07 DIAGNOSIS — Z888 Allergy status to other drugs, medicaments and biological substances status: Secondary | ICD-10-CM | POA: Diagnosis not present

## 2021-02-07 DIAGNOSIS — A419 Sepsis, unspecified organism: Secondary | ICD-10-CM | POA: Diagnosis present

## 2021-02-07 DIAGNOSIS — E041 Nontoxic single thyroid nodule: Secondary | ICD-10-CM | POA: Diagnosis present

## 2021-02-07 DIAGNOSIS — E876 Hypokalemia: Secondary | ICD-10-CM | POA: Diagnosis present

## 2021-02-07 DIAGNOSIS — E669 Obesity, unspecified: Secondary | ICD-10-CM | POA: Diagnosis present

## 2021-02-07 DIAGNOSIS — Z7982 Long term (current) use of aspirin: Secondary | ICD-10-CM | POA: Diagnosis not present

## 2021-02-07 DIAGNOSIS — I471 Supraventricular tachycardia: Secondary | ICD-10-CM | POA: Diagnosis present

## 2021-02-07 DIAGNOSIS — Z87891 Personal history of nicotine dependence: Secondary | ICD-10-CM | POA: Diagnosis not present

## 2021-02-07 DIAGNOSIS — I10 Essential (primary) hypertension: Secondary | ICD-10-CM | POA: Diagnosis present

## 2021-02-07 DIAGNOSIS — Z96659 Presence of unspecified artificial knee joint: Secondary | ICD-10-CM | POA: Diagnosis present

## 2021-02-07 DIAGNOSIS — N12 Tubulo-interstitial nephritis, not specified as acute or chronic: Secondary | ICD-10-CM | POA: Diagnosis present

## 2021-02-07 LAB — COMPREHENSIVE METABOLIC PANEL
ALT: 12 U/L (ref 0–44)
AST: 13 U/L — ABNORMAL LOW (ref 15–41)
Albumin: 2.5 g/dL — ABNORMAL LOW (ref 3.5–5.0)
Alkaline Phosphatase: 92 U/L (ref 38–126)
Anion gap: 7 (ref 5–15)
BUN: 8 mg/dL (ref 6–20)
CO2: 22 mmol/L (ref 22–32)
Calcium: 8.1 mg/dL — ABNORMAL LOW (ref 8.9–10.3)
Chloride: 110 mmol/L (ref 98–111)
Creatinine, Ser: 0.71 mg/dL (ref 0.44–1.00)
GFR, Estimated: >60 mL/min (ref >60)
Glucose, Bld: 113 mg/dL — ABNORMAL HIGH (ref 70–99)
Potassium: 3.5 mmol/L (ref 3.5–5.1)
Sodium: 139 mmol/L (ref 135–145)
Total Bilirubin: 0.6 mg/dL (ref 0.3–1.2)
Total Protein: 5.4 g/dL — ABNORMAL LOW (ref 6.5–8.1)

## 2021-02-07 LAB — CBC WITH DIFFERENTIAL/PLATELET
Abs Immature Granulocytes: 0.18 10*3/uL — ABNORMAL HIGH (ref 0.00–0.07)
Basophils Absolute: 0 10*3/uL (ref 0.0–0.1)
Basophils Relative: 0 %
Eosinophils Absolute: 0 10*3/uL (ref 0.0–0.5)
Eosinophils Relative: 0 %
HCT: 32.5 % — ABNORMAL LOW (ref 36.0–46.0)
Hemoglobin: 10.7 g/dL — ABNORMAL LOW (ref 12.0–15.0)
Immature Granulocytes: 1 %
Lymphocytes Relative: 11 %
Lymphs Abs: 2 10*3/uL (ref 0.7–4.0)
MCH: 30.4 pg (ref 26.0–34.0)
MCHC: 32.9 g/dL (ref 30.0–36.0)
MCV: 92.3 fL (ref 80.0–100.0)
Monocytes Absolute: 2.2 10*3/uL — ABNORMAL HIGH (ref 0.1–1.0)
Monocytes Relative: 13 %
Neutro Abs: 12.8 10*3/uL — ABNORMAL HIGH (ref 1.7–7.7)
Neutrophils Relative %: 75 %
Platelets: 217 10*3/uL (ref 150–400)
RBC: 3.52 MIL/uL — ABNORMAL LOW (ref 3.87–5.11)
RDW: 14.1 % (ref 11.5–15.5)
WBC: 17.2 10*3/uL — ABNORMAL HIGH (ref 4.0–10.5)
nRBC: 0 % (ref 0.0–0.2)

## 2021-02-07 LAB — MAGNESIUM: Magnesium: 2 mg/dL (ref 1.7–2.4)

## 2021-02-07 LAB — FERRITIN: Ferritin: 73 ng/mL (ref 11–307)

## 2021-02-07 LAB — D-DIMER, QUANTITATIVE: D-Dimer, Quant: 0.85 ug/mL-FEU — ABNORMAL HIGH (ref 0.00–0.50)

## 2021-02-07 LAB — CK: Total CK: 27 U/L — ABNORMAL LOW (ref 38–234)

## 2021-02-07 LAB — CBG MONITORING, ED: Glucose-Capillary: 109 mg/dL — ABNORMAL HIGH (ref 70–99)

## 2021-02-07 LAB — C-REACTIVE PROTEIN: CRP: 23.5 mg/dL — ABNORMAL HIGH (ref <1.0)

## 2021-02-07 LAB — HIV ANTIBODY (ROUTINE TESTING W REFLEX): HIV Screen 4th Generation wRfx: NONREACTIVE

## 2021-02-07 MED ORDER — SODIUM CHLORIDE 0.9 % IV SOLN
200.0000 mg | Freq: Once | INTRAVENOUS | Status: AC
  Administered 2021-02-07: 200 mg via INTRAVENOUS
  Filled 2021-02-07: qty 40

## 2021-02-07 MED ORDER — VITAMIN B-12 1000 MCG PO TABS
1000.0000 ug | ORAL_TABLET | Freq: Every day | ORAL | Status: DC
  Administered 2021-02-07 – 2021-02-09 (×3): 1000 ug via ORAL
  Filled 2021-02-07 (×3): qty 1

## 2021-02-07 MED ORDER — SODIUM CHLORIDE 0.9 % IV SOLN
100.0000 mg | Freq: Every day | INTRAVENOUS | Status: DC
  Administered 2021-02-08 – 2021-02-09 (×2): 100 mg via INTRAVENOUS
  Filled 2021-02-07 (×2): qty 20

## 2021-02-07 MED ORDER — PANTOPRAZOLE SODIUM 40 MG PO TBEC
40.0000 mg | DELAYED_RELEASE_TABLET | Freq: Every day | ORAL | Status: DC
  Administered 2021-02-07 – 2021-02-09 (×3): 40 mg via ORAL
  Filled 2021-02-07 (×3): qty 1

## 2021-02-07 NOTE — Assessment & Plan Note (Addendum)
Replete and recheck in PCPs office in 1 week.

## 2021-02-07 NOTE — ED Notes (Signed)
Report to suzanne, rn

## 2021-02-07 NOTE — Assessment & Plan Note (Signed)
Continue PPI ?

## 2021-02-07 NOTE — Assessment & Plan Note (Addendum)
No longer on Neurontin-she just manages with over-the-counter supplements/Tylenol etc.

## 2021-02-07 NOTE — Progress Notes (Signed)
Mobility Specialist Progress Note:   02/07/21 1420  Mobility  Activity Ambulated independently to bathroom;Ambulated independently in room  Level of Assistance Independent  Assistive Device None  Distance Ambulated (ft) 40 ft  Activity Response Tolerated well  $Mobility charge 1 Mobility   Pt with headache this afternoon, no other complaints. Ambulated independently in room/to BR to void. Pt left in bed with all needs met.   Nelta Numbers Mobility Specialist  Phone [phone]

## 2021-02-07 NOTE — Assessment & Plan Note (Addendum)
BP stable-resume lisinopril on discharge.

## 2021-02-07 NOTE — Hospital Course (Addendum)
Patient is a 61 y.o.  female with history of HTN, fibromyalgia, GERD, vitamin B12 deficiency-presenting with subjective fever/chills and generalized body aches.  Patient was recently diagnosed with COVID and completed a course of Paxlovid.  She was found to have sepsis due to pyelonephritis, and suspected to have rebound COVID-19 infection.   Significant Hospital events: 2/2>> admit to [facility] for sepsis due to pyelonephritis/recurrent COVID-19 infection.  Significant imaging studies: 2/2>> CTA chest: No PE-no PNA, incidental 1.9 cm right thyroid nodule 2/2>> CT abdomen/pelvis: Right striated nephrogram suggestive of pyelonephritis-no abscess. 2/2>> CT head: Negative for acute intracranial abnormality  Significant microbiology data: 2/2>> COVID PCR: Positive (CT value 28.1 per micro lab) 2/2>> blood culture: No growth 2/2>> urine culture: E. coli-sensitivity  Procedures: None  Consults:  None

## 2021-02-07 NOTE — Assessment & Plan Note (Addendum)
Repleted. °

## 2021-02-07 NOTE — Assessment & Plan Note (Signed)
Continue supplementation  ?

## 2021-02-07 NOTE — Assessment & Plan Note (Addendum)
Incidentally seen on CTA chest-will need outpatient work-up.  Please note-patient aware of this finding and the risk for malignancy and the need to inform her primary care practitioner upon next follow-up.

## 2021-02-07 NOTE — Assessment & Plan Note (Addendum)
COVID infection -incidental finding patient is  vaccinated   boosted  So far mild URI symptoms no evidence of hypoxia CXR with no infiltrates Cycle threshold is 28 Cycle Threshold >32 (indicates low levels of virus)  Recommend supportive care only  Paxlovid is recommended if Cycle Threshold < 53   Was recently treated with paxlovid Will treat with Ramdesivir  Hold off on steroids noinfiltrates or pulmonary complaints, no hypoxia Obtain inflammatory markers Pronate as needed   Supportive measures Avoid over aggressive fluid resuscitation Airborne precautions

## 2021-02-07 NOTE — Progress Notes (Signed)
PROGRESS NOTE        PATIENT DETAILS Name: Amber Maldonado Age: 61 y.o. Sex: female Date of Birth: 1960-05-02 Admit Date: 02/06/2021 Admitting Physician Toy Baker, MD SAY:[id number], Thayer Jew, MD  Brief Summary: Patient is a 61 y.o.  female with history of HTN, fibromyalgia, GERD, vitamin B12 deficiency-presenting with subjective fever/chills and generalized body aches.  Patient was recently diagnosed with COVID and completed a course of Paxlovid.  She was found to have sepsis due to pyelonephritis, and suspected to have rebound COVID-19 infection.   Significant Hospital events: 2/2>> admit to [facility] for sepsis due to pyelonephritis/recurrent COVID-19 infection.  Significant imaging studies: 2/2>> CTA chest: No PE-no PNA, incidental 1.9 cm right thyroid nodule 2/2>> CT abdomen/pelvis: Right striated nephrogram suggestive of pyelonephritis-no abscess. 2/2>> CT head: Negative for acute intracranial abnormality  Significant microbiology data: 2/2>> COVID PCR: Positive (CT value 28.1 per micro lab) 2/2>> blood culture: Pending 2/2>> urine culture: Pending  Procedures: None  Consults:  None  Subjective: Lying comfortably in bed-denies any chest pain or shortness of breath.  Feels better-but still has some myalgias.  Objective: Vitals: Blood pressure 99/66, pulse 90, temperature 99.5 F (37.5 C), temperature source Oral, resp. rate 20, height 5' (1.524 m), weight 71.2 kg, SpO2 98 %.   Exam: Gen Exam:Alert awake-not in any distress HEENT:atraumatic, normocephalic Chest: B/L clear to auscultation anteriorly CVS:S1S2 regular Abdomen:soft non tender, non distended.  Right CVA angle tender. Extremities:no edema Neurology: Non focal Skin: no rash  Pertinent Labs/Radiology: CBC Latest Ref Rng & Units 02/07/2021 02/06/2021 02/06/2021  WBC 4.0 - 10.5 K/uL 17.2(H) 18.5(H) 17.9(H)  Hemoglobin 12.0 - 15.0 g/dL 10.7(L) 10.5(L) 12.5  Hematocrit 36.0 - 46.0 %  32.5(L) 32.0(L) 37.3  Platelets 150 - 400 K/uL 217 217 248    Lab Results  Component Value Date   NA 139 02/07/2021   K 3.5 02/07/2021   CL 110 02/07/2021   CO2 22 02/07/2021      Assessment/Plan: * Sepsis due to acute pyelonephritis- (present on admission) Slowly improving-BP soft but stable-afebrile-still with leukocytosis.  Continue Rocephin-await blood cultures-have asked RN to send out urine cultures (could be sterile as already on antibiotics)  COVID-19 virus infection- (present on admission) Called microbiology lab-CT value 28.1-agree with Remdesivir-suspect needs only 3 days of treatment.  CT chest without any PNA.  Stable on room air.  Hypokalemia- (present on admission) Repleted  Hypomagnesemia- (present on admission) Repleted  Right thyroid nodule- (present on admission) Incidentally seen on CTA chest-will need outpatient work-up.  Essential hypertension- (present on admission) BP currently controlled-without the use of any antihypertensive-resume antihypertensives when able.  Fibromyalgia- (present on admission) No longer on Neurontin-she just manages with over-the-counter supplements/Tylenol etc.  Vitamin B12 deficiency- (present on admission) Continue supplementation.  GERD (gastroesophageal reflux disease)- (present on admission) Continue PPI   Obesity: Estimated body mass index is 30.66 kg/m as calculated from the following:   Height as of this encounter: 5' (1.524 m).   Weight as of this encounter: 71.2 kg.   Code status:   Code Status: Full Code   DVT Prophylaxis: enoxaparin (LOVENOX) injection 40 mg Start: 02/06/21 2300   Family Communication: None at bedside   Disposition Plan: Status is: Observation The patient will require care spanning > 2 midnights and should be moved to inpatient because: Needs to continue IV antimicrobial therapy and await  cultures.  Although improved-not yet at baseline and not yet stable for discharge.     Planned Discharge Destination:Home   Diet: Diet Order             Diet Carb Modified Fluid consistency: Thin; Room service appropriate? Yes  Diet effective now                     Antimicrobial agents: Anti-infectives (From admission, onward)    Start     Dose/Rate Route Frequency Ordered Stop   02/08/21 1000  remdesivir 100 mg in sodium chloride 0.9 % 100 mL IVPB       See Hyperspace for full Linked Orders Report.   100 mg 200 mL/hr over 30 Minutes Intravenous Daily 02/07/21 0309 02/12/21 0959   02/07/21 2000  cefTRIAXone (ROCEPHIN) 2 g in sodium chloride 0.9 % 100 mL IVPB        2 g 200 mL/hr over 30 Minutes Intravenous Every 24 hours 02/06/21 2205 02/14/21 1959   02/07/21 0400  remdesivir 200 mg in sodium chloride 0.9% 250 mL IVPB       See Hyperspace for full Linked Orders Report.   200 mg 580 mL/hr over 30 Minutes Intravenous Once 02/07/21 0309 02/07/21 0637   02/06/21 1915  cefTRIAXone (ROCEPHIN) 1 g in sodium chloride 0.9 % 100 mL IVPB        1 g 200 mL/hr over 30 Minutes Intravenous  Once 02/06/21 1914 02/06/21 2001        MEDICATIONS: Scheduled Meds:  enoxaparin (LOVENOX) injection  40 mg Subcutaneous Q24H   pantoprazole  40 mg Oral Daily   sodium chloride flush  3 mL Intravenous Q12H   vitamin B-12  1,000 mcg Oral Daily   Continuous Infusions:  cefTRIAXone (ROCEPHIN)  IV     [START ON 02/08/2021] remdesivir 100 mg in NS 100 mL     PRN Meds:.acetaminophen, guaiFENesin-dextromethorphan, HYDROcodone-acetaminophen, ondansetron **OR** ondansetron (ZOFRAN) IV   I have personally reviewed following labs and imaging studies  LABORATORY DATA: CBC: Recent Labs  Lab 02/06/21 1553 02/06/21 2202 02/07/21 0422  WBC 17.9* 18.5* 17.2*  NEUTROABS 14.9* 14.3* 12.8*  HGB 12.5 10.5* 10.7*  HCT 37.3 32.0* 32.5*  MCV 90.8 91.2 92.3  PLT 248 217 086    Basic Metabolic Panel: Recent Labs  Lab 02/06/21 1553 02/06/21 1740 02/07/21 0422  NA 137  --  139  K  2.9*  --  3.5  CL 107  --  110  CO2 21*  --  22  GLUCOSE 119*  --  113*  BUN 8  --  8  CREATININE 0.63  --  0.71  CALCIUM 8.6*  --  8.1*  MG  --  1.6* 2.0    GFR: Estimated Creatinine Clearance: 65.9 mL/min (by C-G formula based on SCr of 0.71 mg/dL).  Liver Function Tests: Recent Labs  Lab 02/06/21 1553 02/07/21 0422  AST 13* 13*  ALT 13 12  ALKPHOS 106 92  BILITOT 0.7 0.6  PROT 6.3* 5.4*  ALBUMIN 3.2* 2.5*   No results for input(s): LIPASE, AMYLASE in the last 168 hours. No results for input(s): AMMONIA in the last 168 hours.  Coagulation Profile: No results for input(s): INR, PROTIME in the last 168 hours.  Cardiac Enzymes: Recent Labs  Lab 02/07/21 0422  CKTOTAL 27*    BNP (last 3 results) No results for input(s): PROBNP in the last 8760 hours.  Lipid Profile: No results for input(s): CHOL, HDL,  LDLCALC, TRIG, CHOLHDL, LDLDIRECT in the last 72 hours.  Thyroid Function Tests: No results for input(s): TSH, T4TOTAL, FREET4, T3FREE, THYROIDAB in the last 72 hours.  Anemia Panel: Recent Labs    02/07/21 0422  FERRITIN 73    Urine analysis:    Component Value Date/Time   COLORURINE YELLOW 02/06/2021 [place] 02/06/2021 1544   LABSPEC 1.015 02/06/2021 1544   PHURINE 6.5 02/06/2021 1544   GLUCOSEU NEGATIVE 02/06/2021 1544   HGBUR SMALL (A) 02/06/2021 1544   BILIRUBINUR NEGATIVE 02/06/2021 1544   KETONESUR NEGATIVE 02/06/2021 1544   PROTEINUR NEGATIVE 02/06/2021 1544   NITRITE NEGATIVE 02/06/2021 1544   LEUKOCYTESUR SMALL (A) 02/06/2021 1544    Sepsis Labs: Lactic Acid, Venous    Component Value Date/Time   LATICACIDVEN 0.8 02/06/2021 1553    MICROBIOLOGY: Recent Results (from the past 240 hour(s))  Resp Panel by RT-PCR (Flu A&B, Covid) Nasopharyngeal Swab     Status: Abnormal   Collection Time: 02/06/21  3:44 PM   Specimen: Nasopharyngeal Swab; Nasopharyngeal(NP) swabs in vial transport medium  Result Value Ref Range  Status   SARS Coronavirus 2 by RT PCR POSITIVE (A) NEGATIVE Final    Comment: (NOTE) SARS-CoV-2 target nucleic acids are DETECTED.  The SARS-CoV-2 RNA is generally detectable in upper respiratory specimens during the acute phase of infection. Positive results are indicative of the presence of the identified virus, but do not rule out bacterial infection or co-infection with other pathogens not detected by the test. Clinical correlation with patient history and other diagnostic information is necessary to determine patient infection status. The expected result is Negative.  Fact Sheet for Patients: EntrepreneurPulse.com.au  Fact Sheet for Healthcare Providers: IncredibleEmployment.be  This test is not yet approved or cleared by the [place] FDA and  has been authorized for detection and/or diagnosis of SARS-CoV-2 by FDA under an Emergency Use Authorization (EUA).  This EUA will remain in effect (meaning this test can be used) for the duration of  the COVID-19 declaration under Section 564(b)(1) of the A ct, 21 U.S.C. section 360bbb-3(b)(1), unless the authorization is terminated or revoked sooner.     Influenza A by PCR NEGATIVE NEGATIVE Final   Influenza B by PCR NEGATIVE NEGATIVE Final    Comment: (NOTE) The Xpert Xpress SARS-CoV-2/FLU/RSV plus assay is intended as an aid in the diagnosis of influenza from Nasopharyngeal swab specimens and should not be used as a sole basis for treatment. Nasal washings and aspirates are unacceptable for Xpert Xpress SARS-CoV-2/FLU/RSV testing.  Fact Sheet for Patients: EntrepreneurPulse.com.au  Fact Sheet for Healthcare Providers: IncredibleEmployment.be  This test is not yet approved or cleared by the [place] FDA and has been authorized for detection and/or diagnosis of SARS-CoV-2 by FDA under an Emergency Use Authorization (EUA). This EUA will remain in  effect (meaning this test can be used) for the duration of the COVID-19 declaration under Section 564(b)(1) of the Act, 21 U.S.C. section 360bbb-3(b)(1), unless the authorization is terminated or revoked.  Performed at [facility], [place] [address] [place], [place] [address]   Culture, blood (Routine X 2) w Reflex to ID Panel     Status: None (Preliminary result)   Collection Time: 02/06/21 11:01 PM   Specimen: BLOOD  Result Value Ref Range Status   Specimen Description BLOOD RIGHT ANTECUBITAL  Final   Special Requests   Final    BOTTLES DRAWN AEROBIC AND ANAEROBIC Blood Culture adequate volume   Culture  Final    NO GROWTH < 12 HOURS Performed at [place] [address], [place], [place] [address]    Report Status PENDING  Incomplete  Culture, blood (Routine X 2) w Reflex to ID Panel     Status: None (Preliminary result)   Collection Time: 02/06/21 11:02 PM   Specimen: BLOOD LEFT HAND  Result Value Ref Range Status   Specimen Description BLOOD LEFT HAND  Final   Special Requests   Final    BOTTLES DRAWN AEROBIC AND ANAEROBIC Blood Culture results may not be optimal due to an inadequate volume of blood received in culture bottles   Culture   Final    NO GROWTH < 12 HOURS Performed at [facility], [place] [address] [place], [place] [address]    Report Status PENDING  Incomplete    RADIOLOGY STUDIES/RESULTS: DG Chest 2 View  Result Date: 02/06/2021 CLINICAL DATA:  Body aches, dizziness. EXAM: CHEST - 2 VIEW COMPARISON:  None. FINDINGS: The heart size and mediastinal contours are within normal limits. Both lungs are clear. The visualized skeletal structures are unremarkable. IMPRESSION: No active cardiopulmonary disease. Electronically Signed   By: Marijo Conception M.D.   On: 02/06/2021 16:42   CT HEAD WO CONTRAST (5MM)  Result Date: 02/06/2021 CLINICAL DATA:  Headache, chronic, new features or increased frequency EXAM: CT HEAD WITHOUT CONTRAST TECHNIQUE:  Contiguous axial images were obtained from the base of the skull through the vertex without intravenous contrast. RADIATION DOSE REDUCTION: This exam was performed according to the departmental dose-optimization program which includes automated exposure control, adjustment of the mA and/or kV according to patient size and/or use of iterative reconstruction technique. COMPARISON:  None. FINDINGS: Brain: No evidence of large-territorial acute infarction. No parenchymal hemorrhage. No mass lesion. No extra-axial collection. No mass effect or midline shift. No hydrocephalus. Basilar cisterns are patent. Vascular: No hyperdense vessel. Atherosclerotic calcifications are present within the cavernous internal carotid arteries. Skull: No acute fracture or focal lesion. Sinuses/Orbits: Paranasal sinuses and mastoid air cells are clear. The orbits are unremarkable. Other: None. IMPRESSION: Negative for acute traumatic injury. Electronically Signed   By: Iven Finn M.D.   On: 02/06/2021 20:59   CT Angio Chest PE W and/or Wo Contrast  Result Date: 02/06/2021 CLINICAL DATA:  Flank pain, kidney stone suspected Nausea/vomiting; Pulmonary embolism (PE) suspected, high prob EXAM: CT ANGIOGRAPHY CHEST CT ABDOMEN AND PELVIS WITH CONTRAST TECHNIQUE: Multidetector CT imaging of the chest was performed using the standard protocol during bolus administration of intravenous contrast. Multiplanar CT image reconstructions and MIPs were obtained to evaluate the vascular anatomy. Multidetector CT imaging of the abdomen and pelvis was performed using the standard protocol during bolus administration of intravenous contrast. RADIATION DOSE REDUCTION: This exam was performed according to the departmental dose-optimization program which includes automated exposure control, adjustment of the mA and/or kV according to patient size and/or use of iterative reconstruction technique. CONTRAST:  165mL OMNIPAQUE IOHEXOL 350 MG/ML SOLN COMPARISON:   CT abdomen pelvis 12/31/2017 FINDINGS: CTA CHEST FINDINGS Cardiovascular: Satisfactory opacification of the pulmonary arteries to the segmental level. No evidence of pulmonary embolism. Normal heart size. No significant pericardial effusion. The thoracic aorta is normal in caliber. No atherosclerotic plaque of the thoracic aorta. At least 1 vessel coronary artery calcifications. Mediastinum/Nodes: No enlarged mediastinal, hilar, or axillary lymph nodes. Trachea and esophagus demonstrate no significant findings. There is a 1.9 cm hypodense right thyroid gland nodule. Lungs/Pleura: Bilateral lower lobe subsegmental atelectasis.  No focal consolidation. No pulmonary nodule. No pulmonary mass. No pleural effusion. No pneumothorax. Musculoskeletal: No chest wall abnormality. No suspicious lytic or blastic osseous lesions. No acute displaced fracture. Review of the MIP images confirms the above findings. CT ABDOMEN and PELVIS FINDINGS Hepatobiliary: The liver is enlarged measuring up to 19 cm. No focal liver abnormality. No gallstones, gallbladder wall thickening, or pericholecystic fluid. No biliary dilatation. Pancreas: No focal lesion. Normal pancreatic contour. No surrounding inflammatory changes. No main pancreatic ductal dilatation. Spleen: Normal in size without focal abnormality. Adrenals/Urinary Tract: No adrenal nodule bilaterally. Striated right nephrogram. The left kidney enhances homogeneously. No abscess formation. No nephroureterolithiasis bilaterally. No hydronephrosis. No hydroureter. The urinary bladder is unremarkable. On delayed imaging, there is no urothelial wall thickening and there are no filling defects in the opacified portions of the bilateral collecting systems or ureters. Stomach/Bowel: Stomach is within normal limits. No evidence of bowel wall thickening or dilatation. Scattered colonic diverticulosis. The appendix is not definitely identified with no inflammatory changes in the right lower  quadrant to suggest acute appendicitis. Vascular/Lymphatic: No abdominal aorta or iliac aneurysm. Mild atherosclerotic plaque of the aorta and its branches. No abdominal, pelvic, or inguinal lymphadenopathy. Reproductive: Status post hysterectomy. No adnexal masses. Other: No intraperitoneal free fluid. No intraperitoneal free gas. No organized fluid collection. Musculoskeletal: No abdominal wall hernia or abnormality. No suspicious lytic or blastic osseous lesions. No acute displaced fracture. Review of the MIP images confirms the above findings. IMPRESSION: 1. No pulmonary embolus. 2. No acute intrapulmonary abnormality. 3. Right striated nephrogram suggestive of pyelonephritis. No definite abscess formation. Correlate with urinalysis. 4. A 1.9 cm hypodense right thyroid gland nodule. Recommend thyroid US (ref: J Am Coll Radiol. 2015 Feb;12(2): 143-50). 5. Colonic diverticulosis with no acute diverticulitis. 6. Hepatomegaly. Electronically Signed   By: Iven Finn M.D.   On: 02/06/2021 20:48   CT ABDOMEN PELVIS W CONTRAST  Result Date: 02/06/2021 CLINICAL DATA:  Flank pain, kidney stone suspected Nausea/vomiting; Pulmonary embolism (PE) suspected, high prob EXAM: CT ANGIOGRAPHY CHEST CT ABDOMEN AND PELVIS WITH CONTRAST TECHNIQUE: Multidetector CT imaging of the chest was performed using the standard protocol during bolus administration of intravenous contrast. Multiplanar CT image reconstructions and MIPs were obtained to evaluate the vascular anatomy. Multidetector CT imaging of the abdomen and pelvis was performed using the standard protocol during bolus administration of intravenous contrast. RADIATION DOSE REDUCTION: This exam was performed according to the departmental dose-optimization program which includes automated exposure control, adjustment of the mA and/or kV according to patient size and/or use of iterative reconstruction technique. CONTRAST:  132mL OMNIPAQUE IOHEXOL 350 MG/ML SOLN COMPARISON:   CT abdomen pelvis 12/31/2017 FINDINGS: CTA CHEST FINDINGS Cardiovascular: Satisfactory opacification of the pulmonary arteries to the segmental level. No evidence of pulmonary embolism. Normal heart size. No significant pericardial effusion. The thoracic aorta is normal in caliber. No atherosclerotic plaque of the thoracic aorta. At least 1 vessel coronary artery calcifications. Mediastinum/Nodes: No enlarged mediastinal, hilar, or axillary lymph nodes. Trachea and esophagus demonstrate no significant findings. There is a 1.9 cm hypodense right thyroid gland nodule. Lungs/Pleura: Bilateral lower lobe subsegmental atelectasis. No focal consolidation. No pulmonary nodule. No pulmonary mass. No pleural effusion. No pneumothorax. Musculoskeletal: No chest wall abnormality. No suspicious lytic or blastic osseous lesions. No acute displaced fracture. Review of the MIP images confirms the above findings. CT ABDOMEN and PELVIS FINDINGS Hepatobiliary: The liver is enlarged measuring up to 19 cm. No focal liver abnormality. No gallstones, gallbladder wall thickening,  or pericholecystic fluid. No biliary dilatation. Pancreas: No focal lesion. Normal pancreatic contour. No surrounding inflammatory changes. No main pancreatic ductal dilatation. Spleen: Normal in size without focal abnormality. Adrenals/Urinary Tract: No adrenal nodule bilaterally. Striated right nephrogram. The left kidney enhances homogeneously. No abscess formation. No nephroureterolithiasis bilaterally. No hydronephrosis. No hydroureter. The urinary bladder is unremarkable. On delayed imaging, there is no urothelial wall thickening and there are no filling defects in the opacified portions of the bilateral collecting systems or ureters. Stomach/Bowel: Stomach is within normal limits. No evidence of bowel wall thickening or dilatation. Scattered colonic diverticulosis. The appendix is not definitely identified with no inflammatory changes in the right lower  quadrant to suggest acute appendicitis. Vascular/Lymphatic: No abdominal aorta or iliac aneurysm. Mild atherosclerotic plaque of the aorta and its branches. No abdominal, pelvic, or inguinal lymphadenopathy. Reproductive: Status post hysterectomy. No adnexal masses. Other: No intraperitoneal free fluid. No intraperitoneal free gas. No organized fluid collection. Musculoskeletal: No abdominal wall hernia or abnormality. No suspicious lytic or blastic osseous lesions. No acute displaced fracture. Review of the MIP images confirms the above findings. IMPRESSION: 1. No pulmonary embolus. 2. No acute intrapulmonary abnormality. 3. Right striated nephrogram suggestive of pyelonephritis. No definite abscess formation. Correlate with urinalysis. 4. A 1.9 cm hypodense right thyroid gland nodule. Recommend thyroid US (ref: J Am Coll Radiol. 2015 Feb;12(2): 143-50). 5. Colonic diverticulosis with no acute diverticulitis. 6. Hepatomegaly. Electronically Signed   By: Iven Finn M.D.   On: 02/06/2021 20:48     LOS: 0 days   Oren Binet, MD  [facility]    To contact the attending provider between 7A-7P or the covering provider during after hours 7P-7A, please log into the web site [email or web address] and access using universal [place] password for that web site. If you do not have the password, please call the hospital operator.  02/07/2021, 11:15 AM

## 2021-02-07 NOTE — Assessment & Plan Note (Addendum)
Sepsis physiology resolved-mild leukocytosis persists-blood cultures negative, urine cultures positive for E. coli-managed with Rocephin-we will transition to [facility] on discharge.

## 2021-02-07 NOTE — Assessment & Plan Note (Addendum)
Called microbiology lab-CT value 28.1.  CT chest without any PNA.  Stable on room air.  CRP significantly elevated but suspect this is from pyelonephritis rather than COVID-19 infection.  Treated with Remdesivir x3 days.

## 2021-02-07 NOTE — ED Notes (Signed)
Pt resting on stretcher, states headache has improved. No acute changes noted. Pt given ginger ale per request. Will continue to monitor.

## 2021-02-08 LAB — MAGNESIUM: Magnesium: 1.6 mg/dL — ABNORMAL LOW (ref 1.7–2.4)

## 2021-02-08 LAB — C-REACTIVE PROTEIN: CRP: 31 mg/dL — ABNORMAL HIGH (ref <1.0)

## 2021-02-08 LAB — COMPREHENSIVE METABOLIC PANEL
ALT: 14 U/L (ref 0–44)
AST: 13 U/L — ABNORMAL LOW (ref 15–41)
Albumin: 2.5 g/dL — ABNORMAL LOW (ref 3.5–5.0)
Alkaline Phosphatase: 96 U/L (ref 38–126)
Anion gap: 10 (ref 5–15)
BUN: 8 mg/dL (ref 6–20)
CO2: 22 mmol/L (ref 22–32)
Calcium: 8.4 mg/dL — ABNORMAL LOW (ref 8.9–10.3)
Chloride: 108 mmol/L (ref 98–111)
Creatinine, Ser: 0.73 mg/dL (ref 0.44–1.00)
GFR, Estimated: >60 mL/min (ref >60)
Glucose, Bld: 89 mg/dL (ref 70–99)
Potassium: 3.3 mmol/L — ABNORMAL LOW (ref 3.5–5.1)
Sodium: 140 mmol/L (ref 135–145)
Total Bilirubin: 0.5 mg/dL (ref 0.3–1.2)
Total Protein: 5.6 g/dL — ABNORMAL LOW (ref 6.5–8.1)

## 2021-02-08 LAB — CBC WITH DIFFERENTIAL/PLATELET
Abs Immature Granulocytes: 0.06 10*3/uL (ref 0.00–0.07)
Basophils Absolute: 0 10*3/uL (ref 0.0–0.1)
Basophils Relative: 0 %
Eosinophils Absolute: 0.1 10*3/uL (ref 0.0–0.5)
Eosinophils Relative: 1 %
HCT: 32.5 % — ABNORMAL LOW (ref 36.0–46.0)
Hemoglobin: 10.4 g/dL — ABNORMAL LOW (ref 12.0–15.0)
Immature Granulocytes: 1 %
Lymphocytes Relative: 12 %
Lymphs Abs: 1.4 10*3/uL (ref 0.7–4.0)
MCH: 29.4 pg (ref 26.0–34.0)
MCHC: 32 g/dL (ref 30.0–36.0)
MCV: 91.8 fL (ref 80.0–100.0)
Monocytes Absolute: 1.2 10*3/uL — ABNORMAL HIGH (ref 0.1–1.0)
Monocytes Relative: 11 %
Neutro Abs: 8.4 10*3/uL — ABNORMAL HIGH (ref 1.7–7.7)
Neutrophils Relative %: 75 %
Platelets: 199 10*3/uL (ref 150–400)
RBC: 3.54 MIL/uL — ABNORMAL LOW (ref 3.87–5.11)
RDW: 14 % (ref 11.5–15.5)
WBC: 11 10*3/uL — ABNORMAL HIGH (ref 4.0–10.5)
nRBC: 0 % (ref 0.0–0.2)

## 2021-02-08 LAB — FERRITIN: Ferritin: 135 ng/mL (ref 11–307)

## 2021-02-08 LAB — D-DIMER, QUANTITATIVE: D-Dimer, Quant: 1.4 ug/mL-FEU — ABNORMAL HIGH (ref 0.00–0.50)

## 2021-02-08 MED ORDER — MAGNESIUM SULFATE 4 GM/100ML IV SOLN
4.0000 g | Freq: Once | INTRAVENOUS | Status: AC
Start: 2021-02-08 — End: 2021-02-08
  Administered 2021-02-08: 4 g via INTRAVENOUS
  Filled 2021-02-08: qty 100

## 2021-02-08 MED ORDER — LORATADINE 10 MG PO TABS
10.0000 mg | ORAL_TABLET | Freq: Every day | ORAL | Status: DC
  Administered 2021-02-08 – 2021-02-09 (×2): 10 mg via ORAL
  Filled 2021-02-08 (×2): qty 1

## 2021-02-08 MED ORDER — POTASSIUM CHLORIDE CRYS ER 20 MEQ PO TBCR
40.0000 meq | EXTENDED_RELEASE_TABLET | Freq: Once | ORAL | Status: AC
  Administered 2021-02-08: 40 meq via ORAL
  Filled 2021-02-08: qty 2

## 2021-02-08 MED ORDER — OXYMETAZOLINE HCL 0.05 % NA SOLN
1.0000 | Freq: Two times a day (BID) | NASAL | Status: DC
Start: 2021-02-08 — End: 2021-02-09
  Administered 2021-02-08 – 2021-02-09 (×3): 1 via NASAL
  Filled 2021-02-08: qty 30

## 2021-02-08 MED ORDER — FLUTICASONE PROPIONATE 50 MCG/ACT NA SUSP
1.0000 | Freq: Every day | NASAL | Status: DC
  Administered 2021-02-08 – 2021-02-09 (×2): 1 via NASAL
  Filled 2021-02-08: qty 16

## 2021-02-08 NOTE — Progress Notes (Signed)
PROGRESS NOTE        PATIENT DETAILS Name: Amber Maldonado Age: 61 y.o. Sex: female Date of Birth: 19-Jun-1960 Admit Date: 02/06/2021 Admitting Physician Evalee Mutton Kristeen Mans, MD IPJ:[id number], Thayer Jew, MD  Brief Summary: Patient is a 60 y.o.  female with history of HTN, fibromyalgia, GERD, vitamin B12 deficiency-presenting with subjective fever/chills and generalized body aches.  Patient was recently diagnosed with COVID and completed a course of Paxlovid.  She was found to have sepsis due to pyelonephritis, and suspected to have rebound COVID-19 infection.   Significant Hospital events: 2/2>> admit to [facility] for sepsis due to pyelonephritis/recurrent COVID-19 infection.  Significant imaging studies: 2/2>> CTA chest: No PE-no PNA, incidental 1.9 cm right thyroid nodule 2/2>> CT abdomen/pelvis: Right striated nephrogram suggestive of pyelonephritis-no abscess. 2/2>> CT head: Negative for acute intracranial abnormality  Significant microbiology data: 2/2>> COVID PCR: Positive (CT value 28.1 per micro lab) 2/2>> blood culture: No growth 2/2>> urine culture: E. coli-sensitivity pending  Procedures: None  Consults:  None  Subjective: Overall better-but continues to complain of some myalgias.  Objective: Vitals: Blood pressure 134/80, pulse 74, temperature 98.7 F (37.1 C), temperature source Oral, resp. rate 17, height 5' (1.524 m), weight 71.2 kg, SpO2 100 %.   Exam: Gen Exam:Alert awake-not in any distress HEENT:atraumatic, normocephalic Chest: B/L clear to auscultation anteriorly CVS:S1S2 regular Abdomen:soft non tender, non distended Extremities:no edema Neurology: Non focal Skin: no rash   Pertinent Labs/Radiology: CBC Latest Ref Rng & Units 02/08/2021 02/07/2021 02/06/2021  WBC 4.0 - 10.5 K/uL 11.0(H) 17.2(H) 18.5(H)  Hemoglobin 12.0 - 15.0 g/dL 10.4(L) 10.7(L) 10.5(L)  Hematocrit 36.0 - 46.0 % 32.5(L) 32.5(L) 32.0(L)  Platelets 150 - 400 K/uL 199 217  217    Lab Results  Component Value Date   NA 140 02/08/2021   K 3.3 (L) 02/08/2021   CL 108 02/08/2021   CO2 22 02/08/2021      Assessment/Plan: * Sepsis due to acute pyelonephritis- (present on admission) Sepsis physiology resolved-mild leukocytosis persists-blood cultures negative, urine cultures positive for E. coli-continue Rocephin.  Await sensitivities.  COVID-19 virus infection- (present on admission) Called microbiology lab-CT value 28.1-agree with Remdesivir-suspect needs only 3 days of treatment.  CT chest without any PNA.  Stable on room air.  CRP significantly elevated but suspect this is from pyelonephritis rather than COVID-19 infection.  Hypokalemia- (present on admission) Replete and recheck.  Hypomagnesemia- (present on admission) Replete and recheck.  Right thyroid nodule- (present on admission) Incidentally seen on CTA chest-will need outpatient work-up.  Essential hypertension- (present on admission) BP currently controlled-without the use of any antihypertensive-resume antihypertensives when able.  Fibromyalgia- (present on admission) No longer on Neurontin-she just manages with over-the-counter supplements/Tylenol etc.  Vitamin B12 deficiency- (present on admission) Continue supplementation.  GERD (gastroesophageal reflux disease)- (present on admission) Continue PPI   Obesity: Estimated body mass index is 30.66 kg/m as calculated from the following:   Height as of this encounter: 5' (1.524 m).   Weight as of this encounter: 71.2 kg.   Code status:   Code Status: Full Code   DVT Prophylaxis: enoxaparin (LOVENOX) injection 40 mg Start: 02/06/21 2300   Family Communication: None at bedside   Disposition Plan: Status is: Observation The patient will require care spanning > 2 midnights and should be moved to inpatient because: Needs to continue IV antimicrobial therapy and await  cultures.  Although improved-not yet at baseline and not yet  stable for discharge.    Planned Discharge Destination:Home likely on 2/5 if clinical improvement continues.   Diet: Diet Order             Diet regular Room service appropriate? Yes; Fluid consistency: Thin  Diet effective now                     Antimicrobial agents: Anti-infectives (From admission, onward)    Start     Dose/Rate Route Frequency Ordered Stop   02/08/21 1000  remdesivir 100 mg in sodium chloride 0.9 % 100 mL IVPB       See Hyperspace for full Linked Orders Report.   100 mg 200 mL/hr over 30 Minutes Intravenous Daily 02/07/21 0309 02/12/21 0959   02/07/21 2000  cefTRIAXone (ROCEPHIN) 2 g in sodium chloride 0.9 % 100 mL IVPB        2 g 200 mL/hr over 30 Minutes Intravenous Every 24 hours 02/06/21 2205 02/14/21 1959   02/07/21 0400  remdesivir 200 mg in sodium chloride 0.9% 250 mL IVPB       See Hyperspace for full Linked Orders Report.   200 mg 580 mL/hr over 30 Minutes Intravenous Once 02/07/21 0309 02/07/21 0637   02/06/21 1915  cefTRIAXone (ROCEPHIN) 1 g in sodium chloride 0.9 % 100 mL IVPB        1 g 200 mL/hr over 30 Minutes Intravenous  Once 02/06/21 1914 02/06/21 2001        MEDICATIONS: Scheduled Meds:  enoxaparin (LOVENOX) injection  40 mg Subcutaneous Q24H   fluticasone  1 spray Each Nare Daily   loratadine  10 mg Oral Daily   oxymetazoline  1 spray Each Nare BID   pantoprazole  40 mg Oral Daily   sodium chloride flush  3 mL Intravenous Q12H   vitamin B-12  1,000 mcg Oral Daily   Continuous Infusions:  cefTRIAXone (ROCEPHIN)  IV 2 g (02/07/21 2105)   magnesium sulfate bolus IVPB     remdesivir 100 mg in NS 100 mL 100 mg (02/08/21 0834)   PRN Meds:.acetaminophen, guaiFENesin-dextromethorphan, HYDROcodone-acetaminophen, ondansetron **OR** ondansetron (ZOFRAN) IV   I have personally reviewed following labs and imaging studies  LABORATORY DATA: CBC: Recent Labs  Lab 02/06/21 1553 02/06/21 2202 02/07/21 0422 02/08/21 0233   WBC 17.9* 18.5* 17.2* 11.0*  NEUTROABS 14.9* 14.3* 12.8* 8.4*  HGB 12.5 10.5* 10.7* 10.4*  HCT 37.3 32.0* 32.5* 32.5*  MCV 90.8 91.2 92.3 91.8  PLT 248 217 217 062    Basic Metabolic Panel: Recent Labs  Lab 02/06/21 1553 02/06/21 1740 02/07/21 0422 02/08/21 0233  NA 137  --  139 140  K 2.9*  --  3.5 3.3*  CL 107  --  110 108  CO2 21*  --  22 22  GLUCOSE 119*  --  113* 89  BUN 8  --  8 8  CREATININE 0.63  --  0.71 0.73  CALCIUM 8.6*  --  8.1* 8.4*  MG  --  1.6* 2.0 1.6*    GFR: Estimated Creatinine Clearance: 65.9 mL/min (by C-G formula based on SCr of 0.73 mg/dL).  Liver Function Tests: Recent Labs  Lab 02/06/21 1553 02/07/21 0422 02/08/21 0233  AST 13* 13* 13*  ALT 13 12 14   ALKPHOS 106 92 96  BILITOT 0.7 0.6 0.5  PROT 6.3* 5.4* 5.6*  ALBUMIN 3.2* 2.5* 2.5*   No results for input(s): LIPASE,  AMYLASE in the last 168 hours. No results for input(s): AMMONIA in the last 168 hours.  Coagulation Profile: No results for input(s): INR, PROTIME in the last 168 hours.  Cardiac Enzymes: Recent Labs  Lab 02/07/21 0422  CKTOTAL 27*    BNP (last 3 results) No results for input(s): PROBNP in the last 8760 hours.  Lipid Profile: No results for input(s): CHOL, HDL, LDLCALC, TRIG, CHOLHDL, LDLDIRECT in the last 72 hours.  Thyroid Function Tests: No results for input(s): TSH, T4TOTAL, FREET4, T3FREE, THYROIDAB in the last 72 hours.  Anemia Panel: Recent Labs    02/07/21 0422 02/08/21 0233  FERRITIN 73 135    Urine analysis:    Component Value Date/Time   COLORURINE YELLOW 02/06/2021 [place] 02/06/2021 1544   LABSPEC 1.015 02/06/2021 1544   PHURINE 6.5 02/06/2021 1544   GLUCOSEU NEGATIVE 02/06/2021 1544   HGBUR SMALL (A) 02/06/2021 1544   BILIRUBINUR NEGATIVE 02/06/2021 1544   KETONESUR NEGATIVE 02/06/2021 1544   PROTEINUR NEGATIVE 02/06/2021 1544   NITRITE NEGATIVE 02/06/2021 1544   LEUKOCYTESUR SMALL (A) 02/06/2021 1544     Sepsis Labs: Lactic Acid, Venous    Component Value Date/Time   LATICACIDVEN 0.8 02/06/2021 1553    MICROBIOLOGY: Recent Results (from the past 240 hour(s))  Resp Panel by RT-PCR (Flu A&B, Covid) Nasopharyngeal Swab     Status: Abnormal   Collection Time: 02/06/21  3:44 PM   Specimen: Nasopharyngeal Swab; Nasopharyngeal(NP) swabs in vial transport medium  Result Value Ref Range Status   SARS Coronavirus 2 by RT PCR POSITIVE (A) NEGATIVE Final    Comment: (NOTE) SARS-CoV-2 target nucleic acids are DETECTED.  The SARS-CoV-2 RNA is generally detectable in upper respiratory specimens during the acute phase of infection. Positive results are indicative of the presence of the identified virus, but do not rule out bacterial infection or co-infection with other pathogens not detected by the test. Clinical correlation with patient history and other diagnostic information is necessary to determine patient infection status. The expected result is Negative.  Fact Sheet for Patients: EntrepreneurPulse.com.au  Fact Sheet for Healthcare Providers: IncredibleEmployment.be  This test is not yet approved or cleared by the [place] FDA and  has been authorized for detection and/or diagnosis of SARS-CoV-2 by FDA under an Emergency Use Authorization (EUA).  This EUA will remain in effect (meaning this test can be used) for the duration of  the COVID-19 declaration under Section 564(b)(1) of the A ct, 21 U.S.C. section 360bbb-3(b)(1), unless the authorization is terminated or revoked sooner.     Influenza A by PCR NEGATIVE NEGATIVE Final   Influenza B by PCR NEGATIVE NEGATIVE Final    Comment: (NOTE) The Xpert Xpress SARS-CoV-2/FLU/RSV plus assay is intended as an aid in the diagnosis of influenza from Nasopharyngeal swab specimens and should not be used as a sole basis for treatment. Nasal washings and aspirates are unacceptable for Xpert Xpress  SARS-CoV-2/FLU/RSV testing.  Fact Sheet for Patients: EntrepreneurPulse.com.au  Fact Sheet for Healthcare Providers: IncredibleEmployment.be  This test is not yet approved or cleared by the [place] FDA and has been authorized for detection and/or diagnosis of SARS-CoV-2 by FDA under an Emergency Use Authorization (EUA). This EUA will remain in effect (meaning this test can be used) for the duration of the COVID-19 declaration under Section 564(b)(1) of the Act, 21 U.S.C. section 360bbb-3(b)(1), unless the authorization is terminated or revoked.  Performed at [facility], [place] [address], [place], [place]  [address]   Urine Culture     Status: Abnormal (Preliminary result)   Collection Time: 02/06/21  3:54 PM   Specimen: Urine, Clean Catch  Result Value Ref Range Status   Specimen Description URINE, CLEAN CATCH  Final   Special Requests NONE  Final   Culture (A)  Final    >=100,000 COLONIES/mL ESCHERICHIA COLI SUSCEPTIBILITIES TO FOLLOW Performed at [facility], [address] [place], [place] [address]    Report Status PENDING  Incomplete  Culture, blood (Routine X 2) w Reflex to ID Panel     Status: None (Preliminary result)   Collection Time: 02/06/21 11:01 PM   Specimen: BLOOD  Result Value Ref Range Status   Specimen Description BLOOD RIGHT ANTECUBITAL  Final   Special Requests   Final    BOTTLES DRAWN AEROBIC AND ANAEROBIC Blood Culture adequate volume   Culture   Final    NO GROWTH 2 DAYS Performed at [facility], [place] [address], [place], [place] [address]    Report Status PENDING  Incomplete  Culture, blood (Routine X 2) w Reflex to ID Panel     Status: None (Preliminary result)   Collection Time: 02/06/21 11:02 PM   Specimen: BLOOD LEFT HAND  Result Value Ref Range Status   Specimen Description BLOOD LEFT HAND  Final   Special Requests   Final    BOTTLES DRAWN AEROBIC AND ANAEROBIC Blood Culture  results may not be optimal due to an inadequate volume of blood received in culture bottles   Culture   Final    NO GROWTH 2 DAYS Performed at [facility], [place] [address], [place], [place] [address]    Report Status PENDING  Incomplete    RADIOLOGY STUDIES/RESULTS: DG Chest 2 View  Result Date: 02/06/2021 CLINICAL DATA:  Body aches, dizziness. EXAM: CHEST - 2 VIEW COMPARISON:  None. FINDINGS: The heart size and mediastinal contours are within normal limits. Both lungs are clear. The visualized skeletal structures are unremarkable. IMPRESSION: No active cardiopulmonary disease. Electronically Signed   By: Marijo Conception M.D.   On: 02/06/2021 16:42   CT HEAD WO CONTRAST (5MM)  Result Date: 02/06/2021 CLINICAL DATA:  Headache, chronic, new features or increased frequency EXAM: CT HEAD WITHOUT CONTRAST TECHNIQUE: Contiguous axial images were obtained from the base of the skull through the vertex without intravenous contrast. RADIATION DOSE REDUCTION: This exam was performed according to the departmental dose-optimization program which includes automated exposure control, adjustment of the mA and/or kV according to patient size and/or use of iterative reconstruction technique. COMPARISON:  None. FINDINGS: Brain: No evidence of large-territorial acute infarction. No parenchymal hemorrhage. No mass lesion. No extra-axial collection. No mass effect or midline shift. No hydrocephalus. Basilar cisterns are patent. Vascular: No hyperdense vessel. Atherosclerotic calcifications are present within the cavernous internal carotid arteries. Skull: No acute fracture or focal lesion. Sinuses/Orbits: Paranasal sinuses and mastoid air cells are clear. The orbits are unremarkable. Other: None. IMPRESSION: Negative for acute traumatic injury. Electronically Signed   By: Iven Finn M.D.   On: 02/06/2021 20:59   CT Angio Chest PE W and/or Wo Contrast  Result Date: 02/06/2021 CLINICAL DATA:  Flank pain, kidney  stone suspected Nausea/vomiting; Pulmonary embolism (PE) suspected, high prob EXAM: CT ANGIOGRAPHY CHEST CT ABDOMEN AND PELVIS WITH CONTRAST TECHNIQUE: Multidetector CT imaging of the chest was performed using the standard protocol during bolus administration of intravenous contrast. Multiplanar CT image reconstructions and MIPs were obtained  to evaluate the vascular anatomy. Multidetector CT imaging of the abdomen and pelvis was performed using the standard protocol during bolus administration of intravenous contrast. RADIATION DOSE REDUCTION: This exam was performed according to the departmental dose-optimization program which includes automated exposure control, adjustment of the mA and/or kV according to patient size and/or use of iterative reconstruction technique. CONTRAST:  176mL OMNIPAQUE IOHEXOL 350 MG/ML SOLN COMPARISON:  CT abdomen pelvis 12/31/2017 FINDINGS: CTA CHEST FINDINGS Cardiovascular: Satisfactory opacification of the pulmonary arteries to the segmental level. No evidence of pulmonary embolism. Normal heart size. No significant pericardial effusion. The thoracic aorta is normal in caliber. No atherosclerotic plaque of the thoracic aorta. At least 1 vessel coronary artery calcifications. Mediastinum/Nodes: No enlarged mediastinal, hilar, or axillary lymph nodes. Trachea and esophagus demonstrate no significant findings. There is a 1.9 cm hypodense right thyroid gland nodule. Lungs/Pleura: Bilateral lower lobe subsegmental atelectasis. No focal consolidation. No pulmonary nodule. No pulmonary mass. No pleural effusion. No pneumothorax. Musculoskeletal: No chest wall abnormality. No suspicious lytic or blastic osseous lesions. No acute displaced fracture. Review of the MIP images confirms the above findings. CT ABDOMEN and PELVIS FINDINGS Hepatobiliary: The liver is enlarged measuring up to 19 cm. No focal liver abnormality. No gallstones, gallbladder wall thickening, or pericholecystic fluid. No  biliary dilatation. Pancreas: No focal lesion. Normal pancreatic contour. No surrounding inflammatory changes. No main pancreatic ductal dilatation. Spleen: Normal in size without focal abnormality. Adrenals/Urinary Tract: No adrenal nodule bilaterally. Striated right nephrogram. The left kidney enhances homogeneously. No abscess formation. No nephroureterolithiasis bilaterally. No hydronephrosis. No hydroureter. The urinary bladder is unremarkable. On delayed imaging, there is no urothelial wall thickening and there are no filling defects in the opacified portions of the bilateral collecting systems or ureters. Stomach/Bowel: Stomach is within normal limits. No evidence of bowel wall thickening or dilatation. Scattered colonic diverticulosis. The appendix is not definitely identified with no inflammatory changes in the right lower quadrant to suggest acute appendicitis. Vascular/Lymphatic: No abdominal aorta or iliac aneurysm. Mild atherosclerotic plaque of the aorta and its branches. No abdominal, pelvic, or inguinal lymphadenopathy. Reproductive: Status post hysterectomy. No adnexal masses. Other: No intraperitoneal free fluid. No intraperitoneal free gas. No organized fluid collection. Musculoskeletal: No abdominal wall hernia or abnormality. No suspicious lytic or blastic osseous lesions. No acute displaced fracture. Review of the MIP images confirms the above findings. IMPRESSION: 1. No pulmonary embolus. 2. No acute intrapulmonary abnormality. 3. Right striated nephrogram suggestive of pyelonephritis. No definite abscess formation. Correlate with urinalysis. 4. A 1.9 cm hypodense right thyroid gland nodule. Recommend thyroid US (ref: J Am Coll Radiol. 2015 Feb;12(2): 143-50). 5. Colonic diverticulosis with no acute diverticulitis. 6. Hepatomegaly. Electronically Signed   By: Iven Finn M.D.   On: 02/06/2021 20:48   CT ABDOMEN PELVIS W CONTRAST  Result Date: 02/06/2021 CLINICAL DATA:  Flank pain,  kidney stone suspected Nausea/vomiting; Pulmonary embolism (PE) suspected, high prob EXAM: CT ANGIOGRAPHY CHEST CT ABDOMEN AND PELVIS WITH CONTRAST TECHNIQUE: Multidetector CT imaging of the chest was performed using the standard protocol during bolus administration of intravenous contrast. Multiplanar CT image reconstructions and MIPs were obtained to evaluate the vascular anatomy. Multidetector CT imaging of the abdomen and pelvis was performed using the standard protocol during bolus administration of intravenous contrast. RADIATION DOSE REDUCTION: This exam was performed according to the departmental dose-optimization program which includes automated exposure control, adjustment of the mA and/or kV according to patient size and/or use of iterative reconstruction technique. CONTRAST:  142mL OMNIPAQUE  IOHEXOL 350 MG/ML SOLN COMPARISON:  CT abdomen pelvis 12/31/2017 FINDINGS: CTA CHEST FINDINGS Cardiovascular: Satisfactory opacification of the pulmonary arteries to the segmental level. No evidence of pulmonary embolism. Normal heart size. No significant pericardial effusion. The thoracic aorta is normal in caliber. No atherosclerotic plaque of the thoracic aorta. At least 1 vessel coronary artery calcifications. Mediastinum/Nodes: No enlarged mediastinal, hilar, or axillary lymph nodes. Trachea and esophagus demonstrate no significant findings. There is a 1.9 cm hypodense right thyroid gland nodule. Lungs/Pleura: Bilateral lower lobe subsegmental atelectasis. No focal consolidation. No pulmonary nodule. No pulmonary mass. No pleural effusion. No pneumothorax. Musculoskeletal: No chest wall abnormality. No suspicious lytic or blastic osseous lesions. No acute displaced fracture. Review of the MIP images confirms the above findings. CT ABDOMEN and PELVIS FINDINGS Hepatobiliary: The liver is enlarged measuring up to 19 cm. No focal liver abnormality. No gallstones, gallbladder wall thickening, or pericholecystic  fluid. No biliary dilatation. Pancreas: No focal lesion. Normal pancreatic contour. No surrounding inflammatory changes. No main pancreatic ductal dilatation. Spleen: Normal in size without focal abnormality. Adrenals/Urinary Tract: No adrenal nodule bilaterally. Striated right nephrogram. The left kidney enhances homogeneously. No abscess formation. No nephroureterolithiasis bilaterally. No hydronephrosis. No hydroureter. The urinary bladder is unremarkable. On delayed imaging, there is no urothelial wall thickening and there are no filling defects in the opacified portions of the bilateral collecting systems or ureters. Stomach/Bowel: Stomach is within normal limits. No evidence of bowel wall thickening or dilatation. Scattered colonic diverticulosis. The appendix is not definitely identified with no inflammatory changes in the right lower quadrant to suggest acute appendicitis. Vascular/Lymphatic: No abdominal aorta or iliac aneurysm. Mild atherosclerotic plaque of the aorta and its branches. No abdominal, pelvic, or inguinal lymphadenopathy. Reproductive: Status post hysterectomy. No adnexal masses. Other: No intraperitoneal free fluid. No intraperitoneal free gas. No organized fluid collection. Musculoskeletal: No abdominal wall hernia or abnormality. No suspicious lytic or blastic osseous lesions. No acute displaced fracture. Review of the MIP images confirms the above findings. IMPRESSION: 1. No pulmonary embolus. 2. No acute intrapulmonary abnormality. 3. Right striated nephrogram suggestive of pyelonephritis. No definite abscess formation. Correlate with urinalysis. 4. A 1.9 cm hypodense right thyroid gland nodule. Recommend thyroid US (ref: J Am Coll Radiol. 2015 Feb;12(2): 143-50). 5. Colonic diverticulosis with no acute diverticulitis. 6. Hepatomegaly. Electronically Signed   By: Iven Finn M.D.   On: 02/06/2021 20:48     LOS: 1 day   Oren Binet, MD  [facility]    To contact  the attending provider between 7A-7P or the covering provider during after hours 7P-7A, please log into the web site [email or web address] and access using universal [place] password for that web site. If you do not have the password, please call the hospital operator.  02/08/2021, 11:51 AM

## 2021-02-09 LAB — CBC WITH DIFFERENTIAL/PLATELET
Abs Immature Granulocytes: 0.04 10*3/uL (ref 0.00–0.07)
Basophils Absolute: 0 10*3/uL (ref 0.0–0.1)
Basophils Relative: 0 %
Eosinophils Absolute: 0.1 10*3/uL (ref 0.0–0.5)
Eosinophils Relative: 1 %
HCT: 32.8 % — ABNORMAL LOW (ref 36.0–46.0)
Hemoglobin: 10.7 g/dL — ABNORMAL LOW (ref 12.0–15.0)
Immature Granulocytes: 1 %
Lymphocytes Relative: 23 %
Lymphs Abs: 1.6 10*3/uL (ref 0.7–4.0)
MCH: 29.6 pg (ref 26.0–34.0)
MCHC: 32.6 g/dL (ref 30.0–36.0)
MCV: 90.9 fL (ref 80.0–100.0)
Monocytes Absolute: 1.1 10*3/uL — ABNORMAL HIGH (ref 0.1–1.0)
Monocytes Relative: 15 %
Neutro Abs: 4.3 10*3/uL (ref 1.7–7.7)
Neutrophils Relative %: 60 %
Platelets: 227 10*3/uL (ref 150–400)
RBC: 3.61 MIL/uL — ABNORMAL LOW (ref 3.87–5.11)
RDW: 13.7 % (ref 11.5–15.5)
WBC: 7.1 10*3/uL (ref 4.0–10.5)
nRBC: 0 % (ref 0.0–0.2)

## 2021-02-09 LAB — URINE CULTURE: Culture: >=100000 — AB

## 2021-02-09 LAB — COMPREHENSIVE METABOLIC PANEL
ALT: 15 U/L (ref 0–44)
AST: 14 U/L — ABNORMAL LOW (ref 15–41)
Albumin: 2.4 g/dL — ABNORMAL LOW (ref 3.5–5.0)
Alkaline Phosphatase: 111 U/L (ref 38–126)
Anion gap: 9 (ref 5–15)
BUN: 11 mg/dL (ref 6–20)
CO2: 24 mmol/L (ref 22–32)
Calcium: 8.1 mg/dL — ABNORMAL LOW (ref 8.9–10.3)
Chloride: 107 mmol/L (ref 98–111)
Creatinine, Ser: 0.61 mg/dL (ref 0.44–1.00)
GFR, Estimated: >60 mL/min (ref >60)
Glucose, Bld: 101 mg/dL — ABNORMAL HIGH (ref 70–99)
Potassium: 3.2 mmol/L — ABNORMAL LOW (ref 3.5–5.1)
Sodium: 140 mmol/L (ref 135–145)
Total Bilirubin: 0.1 mg/dL — ABNORMAL LOW (ref 0.3–1.2)
Total Protein: 5.8 g/dL — ABNORMAL LOW (ref 6.5–8.1)

## 2021-02-09 LAB — MAGNESIUM: Magnesium: 2 mg/dL (ref 1.7–2.4)

## 2021-02-09 LAB — C-REACTIVE PROTEIN: CRP: 22.7 mg/dL — ABNORMAL HIGH (ref <1.0)

## 2021-02-09 MED ORDER — FLUTICASONE PROPIONATE 50 MCG/ACT NA SUSP: 1.0000 | Freq: Every day | NASAL | 0 refills | Status: DC

## 2021-02-09 MED ORDER — LORATADINE 10 MG PO TABS: 10.0000 mg | ORAL_TABLET | Freq: Every day | ORAL | 0 refills | Status: DC

## 2021-02-09 MED ORDER — POTASSIUM CHLORIDE CRYS ER 20 MEQ PO TBCR
40.0000 meq | EXTENDED_RELEASE_TABLET | ORAL | Status: AC
  Administered 2021-02-09 (×2): 40 meq via ORAL
  Filled 2021-02-09 (×2): qty 2

## 2021-02-09 MED ORDER — CEFDINIR 300 MG PO CAPS: 300.0000 mg | ORAL_CAPSULE | Freq: Two times a day (BID) | ORAL | 0 refills | Status: AC

## 2021-02-09 MED ORDER — CEFDINIR 300 MG PO CAPS: 300.0000 mg | ORAL_CAPSULE | Freq: Two times a day (BID) | ORAL | 0 refills | Status: DC

## 2021-02-09 MED ORDER — OXYMETAZOLINE HCL 0.05 % NA SOLN
1.0000 | Freq: Two times a day (BID) | NASAL | 0 refills | Status: DC
Start: 2021-02-09 — End: 2021-02-09

## 2021-02-09 MED ORDER — OXYMETAZOLINE HCL 0.05 % NA SOLN: 1.0000 | Freq: Two times a day (BID) | NASAL | 0 refills | Status: AC

## 2021-02-09 NOTE — Discharge Summary (Signed)
PATIENT DETAILS Name: Amber Maldonado Age: 61 y.o. Sex: female Date of Birth: [birth year] MRN: [id number]. Admitting Physician: Jonetta Osgood, MD FUX:[id number], Thayer Jew, MD  Admit Date: 02/06/2021 Discharge date: 02/09/2021  Recommendations for Outpatient Follow-up:  Follow up with PCP in 1-2 weeks Please obtain CMP/CBC in one week Incidental finding-right thyroid nodule-defer to PCP for further work-up.  Admitted From:  Home   Disposition: Home   Discharge Condition: good  CODE STATUS:   Code Status: Full Code   Diet recommendation:  Diet Order             Diet - low sodium heart healthy           Diet regular Room service appropriate? Yes; Fluid consistency: Thin  Diet effective now                    Brief Summary: Patient is a 61 y.o.  female with history of HTN, fibromyalgia, GERD, vitamin B12 deficiency-presenting with subjective fever/chills and generalized body aches.  Patient was recently diagnosed with COVID and completed a course of Paxlovid.  She was found to have sepsis due to pyelonephritis, and suspected to have rebound COVID-19 infection.   Significant Hospital events: 2/2>> admit to [facility] for sepsis due to pyelonephritis/recurrent COVID-19 infection.  Significant imaging studies: 2/2>> CTA chest: No PE-no PNA, incidental 1.9 cm right thyroid nodule 2/2>> CT abdomen/pelvis: Right striated nephrogram suggestive of pyelonephritis-no abscess. 2/2>> CT head: Negative for acute intracranial abnormality  Significant microbiology data: 2/2>> COVID PCR: Positive (CT value 28.1 per micro lab) 2/2>> blood culture: No growth 2/2>> urine culture: E. coli-sensitivity  Procedures: None  Consults:  None  Brief Hospital Course: * Sepsis due to acute pyelonephritis- (present on admission) Sepsis physiology resolved-mild leukocytosis persists-blood cultures negative, urine cultures positive for E. coli-managed with Rocephin-we will transition to [facility] on  discharge.    COVID-19 virus infection- (present on admission) Called microbiology lab-CT value 28.1.  CT chest without any PNA.  Stable on room air.  CRP significantly elevated but suspect this is from pyelonephritis rather than COVID-19 infection.  Treated with Remdesivir x3 days.  Hypokalemia- (present on admission) Replete and recheck in PCPs office in 1 week.  Hypomagnesemia- (present on admission) Repleted  Right thyroid nodule- (present on admission) Incidentally seen on CTA chest-will need outpatient work-up.  Please note-patient aware of this finding and the risk for malignancy and the need to inform her primary care practitioner upon next follow-up.  Essential hypertension- (present on admission) BP stable-resume lisinopril on discharge.  Fibromyalgia- (present on admission) No longer on Neurontin-she just manages with over-the-counter supplements/Tylenol etc.  Vitamin B12 deficiency- (present on admission) Continue supplementation.  GERD (gastroesophageal reflux disease)- (present on admission) Continue PPI  Obesity: Estimated body mass index is 30.66 kg/m as calculated from the following:   Height as of this encounter: 5' (1.524 m).   Weight as of this encounter: 71.2 kg.    Discharge Diagnoses:  Principal Problem:   Sepsis due to acute pyelonephritis Active Problems:   COVID-19 virus infection   Hypomagnesemia   Hypokalemia   Right thyroid nodule   Essential hypertension   Fibromyalgia   Vitamin B12 deficiency   GERD (gastroesophageal reflux disease)   Sepsis ([place])   Discharge Instructions:  Activity:  As tolerated    Discharge Instructions     Call MD for:  difficulty breathing, headache or visual disturbances   Complete by: As directed    Call MD for:  redness, tenderness, or signs of infection (pain, swelling, redness, odor or green/yellow discharge around incision site)   Complete by: As directed    Call MD for:  temperature >100.4    Complete by: As directed    Diet - low sodium heart healthy   Complete by: As directed    Discharge instructions   Complete by: As directed    Follow with Primary MD  [name], MD in 1-2 weeks  Please get a complete blood count and chemistry panel checked by your Primary MD at your next visit, and again as instructed by your Primary MD.  Get Medicines reviewed and adjusted: Please take all your medications with you for your next visit with your Primary MD  Laboratory/radiological data: Please request your Primary MD to go over all hospital tests and procedure/radiological results at the follow up, please ask your Primary MD to get all Hospital records sent to his/her office.  In some cases, they will be blood work, cultures and biopsy results pending at the time of your discharge. Please request that your primary care M.D. follows up on these results.  Also Note the following: If you experience worsening of your admission symptoms, develop shortness of breath, life threatening emergency, suicidal or homicidal thoughts you must seek medical attention immediately by calling 911 or calling your MD immediately  if symptoms less severe.  You must read complete instructions/literature along with all the possible adverse reactions/side effects for all the Medicines you take and that have been prescribed to you. Take any new Medicines after you have completely understood and accpet all the possible adverse reactions/side effects.   Do not drive when taking Pain medications or sleeping medications (Benzodaizepines)  Do not take more than prescribed Pain, Sleep and Anxiety Medications. It is not advisable to combine anxiety,sleep and pain medications without talking with your primary care practitioner  Special Instructions: If you have smoked or chewed Tobacco  in the last 2 yrs please stop smoking, stop any regular Alcohol  and or any Recreational drug use.  Wear Seat belts while  driving.  Please note: You were cared for by a hospitalist during your hospital stay. Once you are discharged, your primary care physician will handle any further medical issues. Please note that NO REFILLS for any discharge medications will be authorized once you are discharged, as it is imperative that you return to your primary care physician (or establish a relationship with a primary care physician if you do not have one) for your post hospital discharge needs so that they can reassess your need for medications and monitor your lab values.   Incidental finding-1.9 cm right thyroid nodule seen on CT imaging of your chest, please ask your primary care practitioner to initiate further work-up.  In very rare cases-these nodules can turn cancerous.   Increase activity slowly   Complete by: As directed       Allergies as of 02/09/2021       Reactions   Triamcinolone Other (See Comments)   Burning and redness starting at face and traveling down body INJECTION        Medication List     TAKE these medications    acetaminophen 650 MG CR tablet Commonly known as: TYLENOL Take 1,300 mg by mouth in the morning and at bedtime.   amoxicillin 500 MG capsule Commonly known as: AMOXIL Take 2,000 mg by mouth See admin instructions. Take four capsules (2000 mg) by mouth one hour before dental appointments  Aspirin Low Dose 81 MG EC tablet Generic drug: aspirin Take 81 mg by mouth daily.   Calcium-Vitamin D 600-5 MG-MCG Tabs Take 1 tablet by mouth in the morning and at bedtime.   cefdinir 300 MG capsule Commonly known as: OMNICEF Take 1 capsule (300 mg total) by mouth 2 (two) times daily for 4 days.   celecoxib 200 MG capsule Commonly known as: CELEBREX Take 200 mg by mouth daily.   cholecalciferol 25 MCG (1000 UNIT) tablet Commonly known as: VITAMIN D Take 3,000 Units by mouth every evening.   cyanocobalamin 1000 MCG/ML injection Commonly known as: (VITAMIN B-12) Inject 1,000  mcg into the muscle every 30 (thirty) days.   vitamin B-12 1000 MCG tablet Commonly known as: CYANOCOBALAMIN Take 1,000 mcg by mouth daily.   diltiazem 30 MG tablet Commonly known as: CARDIZEM TAKE 1 TABLET(30 MG) BY MOUTH FOUR TIMES DAILY AS NEEDED FOR FAST HEART RATE   Docusate Sodium 100 MG capsule Take 100 mg by mouth 2 (two) times daily.   ESTROVEN PO Take 1 tablet by mouth daily.   FIBER ADULT GUMMIES PO Take 1 tablet by mouth every evening.   fluticasone 50 MCG/ACT nasal spray Commonly known as: FLONASE Place 1 spray into both nostrils daily. Start taking on: February 10, 2021   ibuprofen 800 MG tablet Commonly known as: ADVIL Take 800 mg by mouth every 8 (eight) hours as needed for moderate pain.   lisinopril 5 MG tablet Commonly known as: ZESTRIL Take 5 mg by mouth daily.   loratadine 10 MG tablet Commonly known as: CLARITIN Take 1 tablet (10 mg total) by mouth daily. Start taking on: February 10, 2021   Magnesium 400 MG Tabs Take 800 mg by mouth daily.   oxymetazoline 0.05 % nasal spray Commonly known as: AFRIN Place 1 spray into both nostrils 2 (two) times daily for 2 days.   pantoprazole 40 MG tablet Commonly known as: PROTONIX Take 40 mg by mouth daily.   promethazine-dextromethorphan 6.25-15 MG/5ML syrup Commonly known as: PROMETHAZINE-DM Take 5 mLs by mouth 4 (four) times daily as needed for cough.   vitamin C 1000 MG tablet Take 1,000 mg by mouth every evening.   Zinc 50 MG Tabs Take 50 mg by mouth daily.        Allergies  Allergen Reactions   Triamcinolone Other (See Comments)    Burning and redness starting at face and traveling down body INJECTION     Other Procedures/Studies: DG Chest 2 View  Result Date: 02/06/2021 CLINICAL DATA:  Body aches, dizziness. EXAM: CHEST - 2 VIEW COMPARISON:  None. FINDINGS: The heart size and mediastinal contours are within normal limits. Both lungs are clear. The visualized skeletal structures are  unremarkable. IMPRESSION: No active cardiopulmonary disease. Electronically Signed   By: Marijo Conception M.D.   On: 02/06/2021 16:42   CT HEAD WO CONTRAST (5MM)  Result Date: 02/06/2021 CLINICAL DATA:  Headache, chronic, new features or increased frequency EXAM: CT HEAD WITHOUT CONTRAST TECHNIQUE: Contiguous axial images were obtained from the base of the skull through the vertex without intravenous contrast. RADIATION DOSE REDUCTION: This exam was performed according to the departmental dose-optimization program which includes automated exposure control, adjustment of the mA and/or kV according to patient size and/or use of iterative reconstruction technique. COMPARISON:  None. FINDINGS: Brain: No evidence of large-territorial acute infarction. No parenchymal hemorrhage. No mass lesion. No extra-axial collection. No mass effect or midline shift. No hydrocephalus. Basilar cisterns are patent. Vascular:  No hyperdense vessel. Atherosclerotic calcifications are present within the cavernous internal carotid arteries. Skull: No acute fracture or focal lesion. Sinuses/Orbits: Paranasal sinuses and mastoid air cells are clear. The orbits are unremarkable. Other: None. IMPRESSION: Negative for acute traumatic injury. Electronically Signed   By: Iven Finn M.D.   On: 02/06/2021 20:59   CT Angio Chest PE W and/or Wo Contrast  Result Date: 02/06/2021 CLINICAL DATA:  Flank pain, kidney stone suspected Nausea/vomiting; Pulmonary embolism (PE) suspected, high prob EXAM: CT ANGIOGRAPHY CHEST CT ABDOMEN AND PELVIS WITH CONTRAST TECHNIQUE: Multidetector CT imaging of the chest was performed using the standard protocol during bolus administration of intravenous contrast. Multiplanar CT image reconstructions and MIPs were obtained to evaluate the vascular anatomy. Multidetector CT imaging of the abdomen and pelvis was performed using the standard protocol during bolus administration of intravenous contrast. RADIATION DOSE  REDUCTION: This exam was performed according to the departmental dose-optimization program which includes automated exposure control, adjustment of the mA and/or kV according to patient size and/or use of iterative reconstruction technique. CONTRAST:  134mL OMNIPAQUE IOHEXOL 350 MG/ML SOLN COMPARISON:  CT abdomen pelvis 12/31/2017 FINDINGS: CTA CHEST FINDINGS Cardiovascular: Satisfactory opacification of the pulmonary arteries to the segmental level. No evidence of pulmonary embolism. Normal heart size. No significant pericardial effusion. The thoracic aorta is normal in caliber. No atherosclerotic plaque of the thoracic aorta. At least 1 vessel coronary artery calcifications. Mediastinum/Nodes: No enlarged mediastinal, hilar, or axillary lymph nodes. Trachea and esophagus demonstrate no significant findings. There is a 1.9 cm hypodense right thyroid gland nodule. Lungs/Pleura: Bilateral lower lobe subsegmental atelectasis. No focal consolidation. No pulmonary nodule. No pulmonary mass. No pleural effusion. No pneumothorax. Musculoskeletal: No chest wall abnormality. No suspicious lytic or blastic osseous lesions. No acute displaced fracture. Review of the MIP images confirms the above findings. CT ABDOMEN and PELVIS FINDINGS Hepatobiliary: The liver is enlarged measuring up to 19 cm. No focal liver abnormality. No gallstones, gallbladder wall thickening, or pericholecystic fluid. No biliary dilatation. Pancreas: No focal lesion. Normal pancreatic contour. No surrounding inflammatory changes. No main pancreatic ductal dilatation. Spleen: Normal in size without focal abnormality. Adrenals/Urinary Tract: No adrenal nodule bilaterally. Striated right nephrogram. The left kidney enhances homogeneously. No abscess formation. No nephroureterolithiasis bilaterally. No hydronephrosis. No hydroureter. The urinary bladder is unremarkable. On delayed imaging, there is no urothelial wall thickening and there are no filling  defects in the opacified portions of the bilateral collecting systems or ureters. Stomach/Bowel: Stomach is within normal limits. No evidence of bowel wall thickening or dilatation. Scattered colonic diverticulosis. The appendix is not definitely identified with no inflammatory changes in the right lower quadrant to suggest acute appendicitis. Vascular/Lymphatic: No abdominal aorta or iliac aneurysm. Mild atherosclerotic plaque of the aorta and its branches. No abdominal, pelvic, or inguinal lymphadenopathy. Reproductive: Status post hysterectomy. No adnexal masses. Other: No intraperitoneal free fluid. No intraperitoneal free gas. No organized fluid collection. Musculoskeletal: No abdominal wall hernia or abnormality. No suspicious lytic or blastic osseous lesions. No acute displaced fracture. Review of the MIP images confirms the above findings. IMPRESSION: 1. No pulmonary embolus. 2. No acute intrapulmonary abnormality. 3. Right striated nephrogram suggestive of pyelonephritis. No definite abscess formation. Correlate with urinalysis. 4. A 1.9 cm hypodense right thyroid gland nodule. Recommend thyroid US (ref: J Am Coll Radiol. 2015 Feb;12(2): 143-50). 5. Colonic diverticulosis with no acute diverticulitis. 6. Hepatomegaly. Electronically Signed   By: Iven Finn M.D.   On: 02/06/2021 20:48   CT ABDOMEN PELVIS  W CONTRAST  Result Date: 02/06/2021 CLINICAL DATA:  Flank pain, kidney stone suspected Nausea/vomiting; Pulmonary embolism (PE) suspected, high prob EXAM: CT ANGIOGRAPHY CHEST CT ABDOMEN AND PELVIS WITH CONTRAST TECHNIQUE: Multidetector CT imaging of the chest was performed using the standard protocol during bolus administration of intravenous contrast. Multiplanar CT image reconstructions and MIPs were obtained to evaluate the vascular anatomy. Multidetector CT imaging of the abdomen and pelvis was performed using the standard protocol during bolus administration of intravenous contrast. RADIATION  DOSE REDUCTION: This exam was performed according to the departmental dose-optimization program which includes automated exposure control, adjustment of the mA and/or kV according to patient size and/or use of iterative reconstruction technique. CONTRAST:  115mL OMNIPAQUE IOHEXOL 350 MG/ML SOLN COMPARISON:  CT abdomen pelvis 12/31/2017 FINDINGS: CTA CHEST FINDINGS Cardiovascular: Satisfactory opacification of the pulmonary arteries to the segmental level. No evidence of pulmonary embolism. Normal heart size. No significant pericardial effusion. The thoracic aorta is normal in caliber. No atherosclerotic plaque of the thoracic aorta. At least 1 vessel coronary artery calcifications. Mediastinum/Nodes: No enlarged mediastinal, hilar, or axillary lymph nodes. Trachea and esophagus demonstrate no significant findings. There is a 1.9 cm hypodense right thyroid gland nodule. Lungs/Pleura: Bilateral lower lobe subsegmental atelectasis. No focal consolidation. No pulmonary nodule. No pulmonary mass. No pleural effusion. No pneumothorax. Musculoskeletal: No chest wall abnormality. No suspicious lytic or blastic osseous lesions. No acute displaced fracture. Review of the MIP images confirms the above findings. CT ABDOMEN and PELVIS FINDINGS Hepatobiliary: The liver is enlarged measuring up to 19 cm. No focal liver abnormality. No gallstones, gallbladder wall thickening, or pericholecystic fluid. No biliary dilatation. Pancreas: No focal lesion. Normal pancreatic contour. No surrounding inflammatory changes. No main pancreatic ductal dilatation. Spleen: Normal in size without focal abnormality. Adrenals/Urinary Tract: No adrenal nodule bilaterally. Striated right nephrogram. The left kidney enhances homogeneously. No abscess formation. No nephroureterolithiasis bilaterally. No hydronephrosis. No hydroureter. The urinary bladder is unremarkable. On delayed imaging, there is no urothelial wall thickening and there are no filling  defects in the opacified portions of the bilateral collecting systems or ureters. Stomach/Bowel: Stomach is within normal limits. No evidence of bowel wall thickening or dilatation. Scattered colonic diverticulosis. The appendix is not definitely identified with no inflammatory changes in the right lower quadrant to suggest acute appendicitis. Vascular/Lymphatic: No abdominal aorta or iliac aneurysm. Mild atherosclerotic plaque of the aorta and its branches. No abdominal, pelvic, or inguinal lymphadenopathy. Reproductive: Status post hysterectomy. No adnexal masses. Other: No intraperitoneal free fluid. No intraperitoneal free gas. No organized fluid collection. Musculoskeletal: No abdominal wall hernia or abnormality. No suspicious lytic or blastic osseous lesions. No acute displaced fracture. Review of the MIP images confirms the above findings. IMPRESSION: 1. No pulmonary embolus. 2. No acute intrapulmonary abnormality. 3. Right striated nephrogram suggestive of pyelonephritis. No definite abscess formation. Correlate with urinalysis. 4. A 1.9 cm hypodense right thyroid gland nodule. Recommend thyroid US (ref: J Am Coll Radiol. 2015 Feb;12(2): 143-50). 5. Colonic diverticulosis with no acute diverticulitis. 6. Hepatomegaly. Electronically Signed   By: Iven Finn M.D.   On: 02/06/2021 20:48     TODAY-DAY OF DISCHARGE:  Subjective:   Kelani Robart today has no headache,no chest abdominal pain,no new weakness tingling or numbness, feels much better wants to go home today.   Objective:   Blood pressure 122/76, pulse 69, temperature 98.3 F (36.8 C), temperature source Oral, resp. rate 17, height 5' (1.524 m), weight 71.2 kg, SpO2 96 %.  Intake/Output Summary (Last  24 hours) at 02/09/2021 1008 Last data filed at 02/08/2021 2305 Gross per 24 hour  Intake 580 ml  Output --  Net 580 ml   Filed Weights   02/06/21 1448  Weight: 71.2 kg    Exam: Awake Alert, Oriented *3, No new F.N deficits,  Normal affect [place].AT,PERRAL Supple Neck,No JVD, No cervical lymphadenopathy appriciated.  Symmetrical Chest wall movement, Good air movement bilaterally, CTAB RRR,No Gallops,Rubs or new Murmurs, No Parasternal Heave +ve B.Sounds, Abd Soft, Non tender, No organomegaly appriciated, No rebound -guarding or rigidity. No Cyanosis, Clubbing or edema, No new Rash or bruise   PERTINENT RADIOLOGIC STUDIES: No results found.   PERTINENT LAB RESULTS: CBC: Recent Labs    02/08/21 0233 02/09/21 0111  WBC 11.0* 7.1  HGB 10.4* 10.7*  HCT 32.5* 32.8*  PLT 199 227   CMET CMP     Component Value Date/Time   NA 140 02/09/2021 0111   K 3.2 (L) 02/09/2021 0111   CL 107 02/09/2021 0111   CO2 24 02/09/2021 0111   GLUCOSE 101 (H) 02/09/2021 0111   BUN 11 02/09/2021 0111   CREATININE 0.61 02/09/2021 0111   CALCIUM 8.1 (L) 02/09/2021 0111   PROT 5.8 (L) 02/09/2021 0111   ALBUMIN 2.4 (L) 02/09/2021 0111   AST 14 (L) 02/09/2021 0111   ALT 15 02/09/2021 0111   ALKPHOS 111 02/09/2021 0111   BILITOT 0.1 (L) 02/09/2021 0111   GFRNONAA >60 02/09/2021 0111    GFR Estimated Creatinine Clearance: 65.9 mL/min (by C-G formula based on SCr of 0.61 mg/dL). No results for input(s): LIPASE, AMYLASE in the last 72 hours. Recent Labs    02/07/21 0422  CKTOTAL 27*   Invalid input(s): POCBNP Recent Labs    02/07/21 0422 02/08/21 0233  DDIMER 0.85* 1.40*   Recent Labs    02/06/21 2202  HGBA1C 5.1   No results for input(s): CHOL, HDL, LDLCALC, TRIG, CHOLHDL, LDLDIRECT in the last 72 hours. No results for input(s): TSH, T4TOTAL, T3FREE, THYROIDAB in the last 72 hours.  Invalid input(s): FREET3 Recent Labs    02/07/21 0422 02/08/21 0233  FERRITIN 73 135   Coags: No results for input(s): INR in the last 72 hours.  Invalid input(s): PT Microbiology: Recent Results (from the past 240 hour(s))  Resp Panel by RT-PCR (Flu A&B, Covid) Nasopharyngeal Swab     Status: Abnormal   Collection  Time: 02/06/21  3:44 PM   Specimen: Nasopharyngeal Swab; Nasopharyngeal(NP) swabs in vial transport medium  Result Value Ref Range Status   SARS Coronavirus 2 by RT PCR POSITIVE (A) NEGATIVE Final    Comment: (NOTE) SARS-CoV-2 target nucleic acids are DETECTED.  The SARS-CoV-2 RNA is generally detectable in upper respiratory specimens during the acute phase of infection. Positive results are indicative of the presence of the identified virus, but do not rule out bacterial infection or co-infection with other pathogens not detected by the test. Clinical correlation with patient history and other diagnostic information is necessary to determine patient infection status. The expected result is Negative.  Fact Sheet for Patients: EntrepreneurPulse.com.au  Fact Sheet for Healthcare Providers: IncredibleEmployment.be  This test is not yet approved or cleared by the [place] FDA and  has been authorized for detection and/or diagnosis of SARS-CoV-2 by FDA under an Emergency Use Authorization (EUA).  This EUA will remain in effect (meaning this test can be used) for the duration of  the COVID-19 declaration under Section 564(b)(1) of the A ct, 21 U.S.C. section 360bbb-3(b)(1),  unless the authorization is terminated or revoked sooner.     Influenza A by PCR NEGATIVE NEGATIVE Final   Influenza B by PCR NEGATIVE NEGATIVE Final    Comment: (NOTE) The Xpert Xpress SARS-CoV-2/FLU/RSV plus assay is intended as an aid in the diagnosis of influenza from Nasopharyngeal swab specimens and should not be used as a sole basis for treatment. Nasal washings and aspirates are unacceptable for Xpert Xpress SARS-CoV-2/FLU/RSV testing.  Fact Sheet for Patients: EntrepreneurPulse.com.au  Fact Sheet for Healthcare Providers: IncredibleEmployment.be  This test is not yet approved or cleared by the [place] FDA and has been  authorized for detection and/or diagnosis of SARS-CoV-2 by FDA under an Emergency Use Authorization (EUA). This EUA will remain in effect (meaning this test can be used) for the duration of the COVID-19 declaration under Section 564(b)(1) of the Act, 21 U.S.C. section 360bbb-3(b)(1), unless the authorization is terminated or revoked.  Performed at [facility], [place] [address] [place], [place] [address]   Urine Culture     Status: Abnormal   Collection Time: 02/06/21  3:54 PM   Specimen: Urine, Clean Catch  Result Value Ref Range Status   Specimen Description URINE, CLEAN CATCH  Final   Special Requests   Final    NONE Performed at [facility], [place] [address], [place], [place] [address]    Culture >=100,000 COLONIES/mL ESCHERICHIA COLI (A)  Final   Report Status 02/09/2021 FINAL  Final   Organism ID, Bacteria ESCHERICHIA COLI (A)  Final      Susceptibility   Escherichia coli - MIC*    AMPICILLIN >=32 RESISTANT Resistant     CEFAZOLIN <=4 SENSITIVE Sensitive     CEFEPIME <=0.12 SENSITIVE Sensitive     CEFTRIAXONE <=0.25 SENSITIVE Sensitive     CIPROFLOXACIN <=0.25 SENSITIVE Sensitive     GENTAMICIN <=1 SENSITIVE Sensitive     IMIPENEM <=0.25 SENSITIVE Sensitive     NITROFURANTOIN <=16 SENSITIVE Sensitive     TRIMETH/SULFA <=20 SENSITIVE Sensitive     AMPICILLIN/SULBACTAM >=32 RESISTANT Resistant     PIP/TAZO <=4 SENSITIVE Sensitive     * >=100,000 COLONIES/mL ESCHERICHIA COLI  Culture, blood (Routine X 2) w Reflex to ID Panel     Status: None (Preliminary result)   Collection Time: 02/06/21 11:01 PM   Specimen: BLOOD  Result Value Ref Range Status   Specimen Description BLOOD RIGHT ANTECUBITAL  Final   Special Requests   Final    BOTTLES DRAWN AEROBIC AND ANAEROBIC Blood Culture adequate volume   Culture   Final    NO GROWTH 2 DAYS Performed at [facility], [address], [place], [place] [address]    Report Status PENDING  Incomplete  Culture,  blood (Routine X 2) w Reflex to ID Panel     Status: None (Preliminary result)   Collection Time: 02/06/21 11:02 PM   Specimen: BLOOD LEFT HAND  Result Value Ref Range Status   Specimen Description BLOOD LEFT HAND  Final   Special Requests   Final    BOTTLES DRAWN AEROBIC AND ANAEROBIC Blood Culture results may not be optimal due to an inadequate volume of blood received in culture bottles   Culture   Final    NO GROWTH 2 DAYS Performed at [facility], [place] [address] [place], [place] [address]    Report Status PENDING  Incomplete    FURTHER DISCHARGE INSTRUCTIONS:  Get Medicines reviewed and adjusted: Please take all your  medications with you for your next visit with your Primary MD  Laboratory/radiological data: Please request your Primary MD to go over all hospital tests and procedure/radiological results at the follow up, please ask your Primary MD to get all Hospital records sent to his/her office.  In some cases, they will be blood work, cultures and biopsy results pending at the time of your discharge. Please request that your primary care M.D. goes through all the records of your hospital data and follows up on these results.  Also Note the following: If you experience worsening of your admission symptoms, develop shortness of breath, life threatening emergency, suicidal or homicidal thoughts you must seek medical attention immediately by calling 911 or calling your MD immediately  if symptoms less severe.  You must read complete instructions/literature along with all the possible adverse reactions/side effects for all the Medicines you take and that have been prescribed to you. Take any new Medicines after you have completely understood and accpet all the possible adverse reactions/side effects.   Do not drive when taking Pain medications or sleeping medications (Benzodaizepines)  Do not take more than prescribed Pain, Sleep and Anxiety Medications. It is not advisable  to combine anxiety,sleep and pain medications without talking with your primary care practitioner  Special Instructions: If you have smoked or chewed Tobacco  in the last 2 yrs please stop smoking, stop any regular Alcohol  and or any Recreational drug use.  Wear Seat belts while driving.  Please note: You were cared for by a hospitalist during your hospital stay. Once you are discharged, your primary care physician will handle any further medical issues. Please note that NO REFILLS for any discharge medications will be authorized once you are discharged, as it is imperative that you return to your primary care physician (or establish a relationship with a primary care physician if you do not have one) for your post hospital discharge needs so that they can reassess your need for medications and monitor your lab values.  Total Time spent coordinating discharge including counseling, education and face to face time equals greater than 30 minutes.  Signed: Bintou Lafata 02/09/2021 10:08 AM

## 2021-02-11 LAB — CULTURE, BLOOD (ROUTINE X 2)
Culture: NO GROWTH
Culture: NO GROWTH
Special Requests: ADEQUATE

## 2021-03-04 ENCOUNTER — Encounter (HOSPITAL_COMMUNITY): Payer: Self-pay | Admitting: Radiology

## 2021-03-24 ENCOUNTER — Encounter: Payer: Self-pay | Admitting: Internal Medicine

## 2021-03-24 ENCOUNTER — Other Ambulatory Visit: Payer: Self-pay

## 2021-03-24 ENCOUNTER — Ambulatory Visit (INDEPENDENT_AMBULATORY_CARE_PROVIDER_SITE_OTHER): Payer: Medicare Other | Admitting: Internal Medicine

## 2021-03-24 VITALS — BP 112/68 | HR 65 | Ht 60.0 in | Wt 161.0 lb

## 2021-03-24 DIAGNOSIS — E041 Nontoxic single thyroid nodule: Secondary | ICD-10-CM | POA: Diagnosis not present

## 2021-03-24 LAB — TSH: TSH: 0.87 u[IU]/mL (ref 0.35–5.50)

## 2021-03-24 LAB — T4, FREE: Free T4: 0.68 ng/dL (ref 0.60–1.60)

## 2021-03-24 NOTE — Progress Notes (Signed)
? ? ?Name: Amber Maldonado  ?MRN/ DOB: [id number], 07-11-1960    ?Age/ Sex: 61 y.o., female   ? ?PCP: [name], MD   ?Reason for Endocrinology Evaluation: Thyroid nodule   ?   ?Date of Initial Endocrinology Evaluation: 03/24/2021   ? ? ?HPI: ?Ms. Amber Maldonado is a 61 y.o. female with a past medical history of HTN. The patient presented for initial endocrinology clinic visit on 03/24/2021 for consultative assistance with her Thyroid nodule .  ? ? ?She was hospitalized in 01/2021 for pyelonephritis , she was noted on CT imaging to have a right 1.9  cm thyroid nodule.  ? ?She had no local neck symptoms until ~ 2 weeks ago when she noted increase in size. Marland Kitchen Denies pain or dysphagia  ? ?Denies prior exposure to radiation  ?No biotin use  ?Denies palpitations  ?Has chronic occasional constipation but no diarrhea  ?Denies palpitations  ? ? ? ?Denies Fh of thyroid disease  ?Has personal hx of melanoma  ? ? ?Mother with Sq. Cell carcinoma  ? ?She is a Psychologist, sport and exercise  ?  ? ? ?HISTORY:  ?Past Medical History:  ?Past Medical History:  ?Diagnosis Date  ? Hypertension   ? Irritable bowel disease   ? SVT (supraventricular tachycardia) ([place])   ? ?Past Surgical History:  ?Past Surgical History:  ?Procedure Laterality Date  ? KNEE ARTHROPLASTY    ?  ?Social History:  reports that she has quit smoking. She has never used smokeless tobacco. She reports current alcohol use. She reports that she does not use drugs. ?Family History: family history is not on file. ? ? ?HOME MEDICATIONS: ?Allergies as of 03/24/2021   ? ?   Reactions  ? Triamcinolone Other (See Comments)  ? Burning and redness starting at face and traveling down body INJECTION  ? ?  ? ?  ?Medication List  ?  ? ?  ? Accurate as of March 24, 2021  4:14 PM. If you have any questions, ask your nurse or doctor.  ?  ?  ? ?  ? ?STOP taking these medications   ? ?Aspirin Low Dose 81 MG EC tablet ?Generic drug: aspirin ?Stopped by: Dorita Sciara, MD ?  ?diltiazem 30 MG  tablet ?Commonly known as: CARDIZEM ?Stopped by: Dorita Sciara, MD ?  ?ibuprofen 800 MG tablet ?Commonly known as: ADVIL ?Stopped by: Dorita Sciara, MD ?  ?promethazine-dextromethorphan 6.25-15 MG/5ML syrup ?Commonly known as: PROMETHAZINE-DM ?Stopped by: Dorita Sciara, MD ?  ? ?  ? ?TAKE these medications   ? ?acetaminophen 650 MG CR tablet ?Commonly known as: TYLENOL ?Take 1,300 mg by mouth in the morning and at bedtime. ?  ?amoxicillin 500 MG capsule ?Commonly known as: AMOXIL ?Take 2,000 mg by mouth See admin instructions. Take four capsules (2000 mg) by mouth one hour before dental appointments ?  ?Calcium-Vitamin D 600-5 MG-MCG Tabs ?Take 1 tablet by mouth in the morning and at bedtime. ?  ?celecoxib 200 MG capsule ?Commonly known as: CELEBREX ?Take 200 mg by mouth daily. ?  ?cholecalciferol 25 MCG (1000 UNIT) tablet ?Commonly known as: VITAMIN D ?Take 3,000 Units by mouth every evening. ?  ?cyanocobalamin 1000 MCG/ML injection ?Commonly known as: (VITAMIN B-12) ?Inject 1,000 mcg into the muscle every 30 (thirty) days. ?  ?vitamin B-12 1000 MCG tablet ?Commonly known as: CYANOCOBALAMIN ?Take 1,000 mcg by mouth daily. ?  ?Docusate Sodium 100 MG capsule ?Take 100 mg by mouth 2 (two) times daily. ?  ?  ESTROVEN PO ?Take 1 tablet by mouth daily. ?  ?FIBER ADULT GUMMIES PO ?Take 1 tablet by mouth every evening. ?  ?fluticasone 50 MCG/ACT nasal spray ?Commonly known as: FLONASE ?Place 1 spray into both nostrils daily. ?  ?lisinopril 5 MG tablet ?Commonly known as: ZESTRIL ?Take 5 mg by mouth daily. ?  ?loratadine 10 MG tablet ?Commonly known as: CLARITIN ?Take 1 tablet (10 mg total) by mouth daily. ?  ?Magnesium 400 MG Tabs ?Take 800 mg by mouth daily. ?  ?pantoprazole 40 MG tablet ?Commonly known as: PROTONIX ?Take 40 mg by mouth daily. ?  ?vitamin C 1000 MG tablet ?Take 1,000 mg by mouth every evening. ?  ?Zinc 50 MG Tabs ?Take 50 mg by mouth daily. ?  ? ?  ?  ? ? ?REVIEW OF SYSTEMS: ?A  comprehensive ROS was conducted with the patient and is negative except as per HPI  ? ? ? ?OBJECTIVE:  ?VS: BP 112/68 (BP Location: Left Arm, Patient Position: Sitting, Cuff Size: Small)   Pulse 65   Ht 5' (1.524 m)   Wt 161 lb (73 kg)   SpO2 99%   BMI 31.44 kg/m?   ? ?Wt Readings from Last 3 Encounters:  ?03/24/21 161 lb (73 kg)  ?02/06/21 157 lb (71.2 kg)  ?04/12/20 162 lb (73.5 kg)  ? ? ? ?EXAM: ?General: Pt appears well and is in NAD  ?Eyes: External eye exam normal without stare, lid lag or exophthalmos.  EOM intact.  PERRL.  ?Neck: General: Supple without adenopathy. ?Thyroid: Thyroid size normal. Right nodule appreciated.  ?Lungs: Clear with good BS bilat with no rales, rhonchi, or wheezes  ?Heart: Auscultation: RRR.  ?Abdomen: Normoactive bowel sounds, soft, nontender, without masses or organomegaly palpable  ?Extremities:  ?BL LE: No pretibial edema normal ROM and strength.  ?Mental Status: Judgment, insight: Intact ?Orientation: Oriented to time, place, and person ?Mood and affect: No depression, anxiety, or agitation  ? ? ? ?DATA REVIEWED: ? Latest Reference Range & Units 03/24/21 14:57  ?TSH 0.35 - 5.50 uIU/mL 0.87  ?T4,Free(Direct) 0.60 - 1.60 ng/dL 0.68  ? ? ? ? ? ? CT chest 02/06/2021 ? ?Cardiovascular: Satisfactory opacification of the pulmonary arteries ?to the segmental level. No evidence of pulmonary embolism. Normal ?heart size. No significant pericardial effusion. The thoracic aorta ?is normal in caliber. No atherosclerotic plaque of the thoracic ?aorta. At least 1 vessel coronary artery calcifications. ?  ?Mediastinum/Nodes: No enlarged mediastinal, hilar, or axillary lymph ?nodes. Trachea and esophagus demonstrate no significant findings. ?There is a 1.9 cm hypodense right thyroid gland nodule. ?  ?Lungs/Pleura: Bilateral lower lobe subsegmental atelectasis. No ?focal consolidation. No pulmonary nodule. No pulmonary mass. No ?pleural effusion. No pneumothorax. ?  ?Musculoskeletal: ?  ?No  chest wall abnormality. ?  ?No suspicious lytic or blastic osseous lesions. No acute displaced ?fracture. ?  ?Review of the MIP images confirms the above findings. ?  ?CT ABDOMEN and PELVIS FINDINGS ?  ?Hepatobiliary: The liver is enlarged measuring up to 19 cm. No focal ?liver abnormality. No gallstones, gallbladder wall thickening, or ?pericholecystic fluid. No biliary dilatation. ?  ?Pancreas: No focal lesion. Normal pancreatic contour. No surrounding ?inflammatory changes. No main pancreatic ductal dilatation. ?  ?Spleen: Normal in size without focal abnormality. ?  ?Adrenals/Urinary Tract: ?  ?No adrenal nodule bilaterally. ?  ?Striated right nephrogram. The left kidney enhances homogeneously. ?No abscess formation. No nephroureterolithiasis bilaterally. ?  ?No hydronephrosis. No hydroureter. ?  ?The urinary bladder is unremarkable. ?  ?  On delayed imaging, there is no urothelial wall thickening and there ?are no filling defects in the opacified portions of the bilateral ?collecting systems or ureters. ?  ?Stomach/Bowel: Stomach is within normal limits. No evidence of bowel ?wall thickening or dilatation. Scattered colonic diverticulosis. The ?appendix is not definitely identified with no inflammatory changes ?in the right lower quadrant to suggest acute appendicitis. ?  ?Vascular/Lymphatic: No abdominal aorta or iliac aneurysm. Mild ?atherosclerotic plaque of the aorta and its branches. No abdominal, ?pelvic, or inguinal lymphadenopathy. ?  ?Reproductive: Status post hysterectomy. No adnexal masses. ?  ?Other: No intraperitoneal free fluid. No intraperitoneal free gas. ?No organized fluid collection. ?  ?Musculoskeletal: ?  ?No abdominal wall hernia or abnormality. ?  ?No suspicious lytic or blastic osseous lesions. No acute displaced ?fracture. ?  ?Review of the MIP images confirms the above findings. ?  ?IMPRESSION: ?1. No pulmonary embolus. ?2. No acute intrapulmonary abnormality. ?3. Right striated  nephrogram suggestive of pyelonephritis. No ?definite abscess formation. Correlate with urinalysis. ?4. A 1.9 cm hypodense right thyroid gland nodule. Recommend thyroid ?[place] (ref: J Am Coll Radiol. 2015 Feb;12(2): 143-50). ?5. Co

## 2021-03-27 ENCOUNTER — Telehealth: Payer: Self-pay | Admitting: Internal Medicine

## 2021-03-27 ENCOUNTER — Ambulatory Visit
Admission: RE | Admit: 2021-03-27 | Discharge: 2021-03-27 | Disposition: A | Discharge status: Home / Self Care | Payer: Medicare Other | Source: Ambulatory Visit | Attending: Internal Medicine | Admitting: Internal Medicine

## 2021-03-27 DIAGNOSIS — E041 Nontoxic single thyroid nodule: Secondary | ICD-10-CM

## 2021-03-27 NOTE — Telephone Encounter (Signed)
Discussed thyroid ultrasound results with the patient on 03/27/2021 at 1515 ? ? ?Patient in agreement in proceeding with a right thyroid nodule FNA ? ? ?Order has been placed ? ? ? ?Mack Guise, MD ? ?[facility]  ?[facility] ?[address] ?[place], [place] [address] ?Phone: [phone] ?FAX: [phone] ? ?

## 2021-04-01 ENCOUNTER — Other Ambulatory Visit (HOSPITAL_COMMUNITY)
Admission: RE | Admit: 2021-04-01 | Discharge: 2021-04-01 | Disposition: A | Discharge status: Home / Self Care | Payer: Medicare Other | Source: Ambulatory Visit | Attending: Interventional Radiology | Admitting: Interventional Radiology

## 2021-04-01 ENCOUNTER — Ambulatory Visit
Admission: RE | Admit: 2021-04-01 | Discharge: 2021-04-01 | Disposition: A | Discharge status: Home / Self Care | Payer: Medicare Other | Source: Ambulatory Visit | Attending: Internal Medicine | Admitting: Internal Medicine

## 2021-04-01 ENCOUNTER — Other Ambulatory Visit: Payer: Self-pay

## 2021-04-01 DIAGNOSIS — E041 Nontoxic single thyroid nodule: Secondary | ICD-10-CM | POA: Diagnosis present

## 2021-04-02 LAB — CYTOLOGY - NON PAP

## 2021-04-03 ENCOUNTER — Telehealth: Payer: Self-pay

## 2021-04-03 DIAGNOSIS — E041 Nontoxic single thyroid nodule: Secondary | ICD-10-CM

## 2021-04-03 NOTE — Telephone Encounter (Signed)
Patient states that the nodules has double twice the size and that something needs to be done. Patient states that she has bulging in her neck.  ?

## 2021-04-03 NOTE — Telephone Encounter (Signed)
Patient calling regarding result of the FNA and what the next steps are.  ?

## 2021-04-03 NOTE — Telephone Encounter (Signed)
Patient interested in surgical intervention of the right thyroid nodule given a local neck symptoms and large size. ? ? ?We discussed benign FNA of the right thyroid nodule ? ? ? ? ? ?A referral has been placed to surgery ? ?She understands that lifelong LT-4 replacement may be necessary after surgery ? ? ?Patient expressed understanding ?

## 2021-04-22 ENCOUNTER — Ambulatory Visit: Payer: Medicare Other | Admitting: Adult Health

## 2021-05-08 ENCOUNTER — Encounter (HOSPITAL_COMMUNITY): Payer: Self-pay | Admitting: Emergency Medicine

## 2021-05-08 ENCOUNTER — Emergency Department (HOSPITAL_COMMUNITY)
Admission: EM | Admit: 2021-05-08 | Discharge: 2021-05-08 | Disposition: A | Discharge status: Home / Self Care | Payer: Medicare Other | Attending: Emergency Medicine | Admitting: Emergency Medicine

## 2021-05-08 ENCOUNTER — Emergency Department (HOSPITAL_COMMUNITY): Payer: Medicare Other

## 2021-05-08 DIAGNOSIS — Z79899 Other long term (current) drug therapy: Secondary | ICD-10-CM | POA: Insufficient documentation

## 2021-05-08 DIAGNOSIS — M545 Low back pain, unspecified: Secondary | ICD-10-CM | POA: Diagnosis present

## 2021-05-08 DIAGNOSIS — M5431 Sciatica, right side: Secondary | ICD-10-CM | POA: Insufficient documentation

## 2021-05-08 DIAGNOSIS — I10 Essential (primary) hypertension: Secondary | ICD-10-CM | POA: Diagnosis not present

## 2021-05-08 MED ORDER — DEXAMETHASONE SODIUM PHOSPHATE 10 MG/ML IJ SOLN: 10.0000 mg | Freq: Once | INTRAMUSCULAR | Status: DC

## 2021-05-08 MED ORDER — KETOROLAC TROMETHAMINE 15 MG/ML IJ SOLN: 15.0000 mg | Freq: Once | INTRAMUSCULAR | Status: DC

## 2021-05-08 MED ORDER — PREDNISONE 10 MG (21) PO TBPK: ORAL_TABLET | Freq: Every day | ORAL | 0 refills | Status: DC

## 2021-05-08 MED ORDER — METHOCARBAMOL 500 MG PO TABS: 500.0000 mg | ORAL_TABLET | Freq: Three times a day (TID) | ORAL | 0 refills | Status: DC | PRN

## 2021-05-08 NOTE — ED Provider Triage Note (Signed)
Emergency Medicine Provider Triage Evaluation Note ? ?Tziporah Knoke , a 61 y.o. female  was evaluated in triage.  Pt complains of lower back pain and tailbone pain for the last 3 weeks.  Was seen by her PCP and started on prednisone which improved it but the second she stopped taking prednisone the pain returned.  She denies urinary complaints, bowel incontinence, focal weakness or numbness. ? ?Review of Systems  ?Positive:  ?Negative: See above ? ?Physical Exam  ?BP (!) 146/107 (BP Location: Right Arm)   Pulse 86   Temp 98.2 ?F (36.8 ?C) (Oral)   Resp 14   SpO2 100%  ?Gen:   Awake, no distress   ?Resp:  Normal effort  ?MSK:   Moves extremities without difficulty  ?Other:  There is tenderness over the lower lumbar spine and sacral area.  5/5 strength to the lower extremities.  Normal sensation to lower extremities.  There is also point tenderness to the right paralumbar region on the buttocks. ? ?Medical Decision Making  ?Medically screening exam initiated at 11:21 AM.  Appropriate orders placed.  Manika Hast was informed that the remainder of the evaluation will be completed by another provider, this initial triage assessment does not replace that evaluation, and the importance of remaining in the ED until their evaluation is complete. ? ? ?  ?Hendricks Limes, PA-C ?05/08/21 1122 ? ?

## 2021-05-08 NOTE — Discharge Instructions (Signed)
Follow-up with your primary care doctor as well as with the spine specialist.  Call both of their numbers tomorrow morning to schedule a follow-up appointment.  I would additionally recommend following up with physical therapy as previously recommended by your primary doctor.  Recommend course of steroids.  Please complete steroid taper.  In addition would use the muscle relaxer, previously prescribed tramadol and Tylenol as needed for pain.  Do not take the muscle relaxer or tramadol while driving as these can make you drowsy. ?

## 2021-05-08 NOTE — ED Provider Notes (Signed)
?[facility] ?Provider Note ? ? ?CSN: [id number] ?Arrival date & time: 05/08/21  1035 ? ?  ? ?History ? ?Chief Complaint  ?Patient presents with  ? Tailbone Pain  ? ? ?Amber Maldonado is a 61 y.o. female.  Presenting to ER due to concern for back pain.  States that she has had back pain in the past but this seems to be somewhat worse.  Pain is in her lower back and radiates down her right leg.  States that she would her primary care doctor, but completed a course of steroids and this helped her pain.  She was also prescribed some tramadol which seemed to help but not completely resolve her pain.  Since stopping the steroids she feels like her pain has been getting worse.  She does not have any associated numbness or weakness.  Her pain is worse with movement and improved with rest.  She does not have any associated bladder or bowel incontinence, no saddle anesthesia.  She denies any prior surgical history in her spine.  No IVDU. ? ?HPI ? ?  ? ?Home Medications ?Prior to Admission medications   ?Medication Sig Start Date End Date Taking? Authorizing Provider  ?acetaminophen (TYLENOL) 650 MG CR tablet Take 1,300 mg by mouth in the morning and at bedtime.   Yes [provider]  ?amoxicillin (AMOXIL) 500 MG capsule Take 2,000 mg by mouth See admin instructions. Take 2,000 mg by mouth one hour before dental appointments 08/04/19  Yes [provider]  ?Ascorbic Acid (VITAMIN C) 1000 MG tablet Take 1,000 mg by mouth every evening.   Yes [provider]  ?Calcium-Vitamin D 600-5 MG-MCG TABS Take 1 tablet by mouth in the morning and at bedtime.   Yes [provider]  ?celecoxib (CELEBREX) 200 MG capsule Take 200 mg by mouth daily. 12/23/20  Yes [provider]  ?cholecalciferol (VITAMIN D) 25 MCG (1000 UNIT) tablet Take 2,000 Units by mouth every evening.   Yes [provider]  ?cyanocobalamin (,VITAMIN B-12,) 1000 MCG/ML injection Inject  1,000 mcg into the muscle every 30 (thirty) days.   Yes [provider]  ?Docusate Sodium 100 MG capsule Take 100 mg by mouth 2 (two) times daily.   Yes [provider]  ?fluticasone (FLONASE) 50 MCG/ACT nasal spray Place 1 spray into both nostrils daily. ?Patient taking differently: Place 1-2 sprays into both nostrils daily as needed for allergies or rhinitis. 02/10/21  Yes Ghimire, Henreitta Leber, MD  ?lisinopril (ZESTRIL) 5 MG tablet Take 5 mg by mouth daily. 12/15/19  Yes [provider]  ?Magnesium 400 MG TABS Take 400 mg by mouth in the morning and at bedtime.   Yes [provider]  ?methocarbamol (ROBAXIN) 500 MG tablet Take 1 tablet (500 mg total) by mouth every 8 (eight) hours as needed for muscle spasms. 05/08/21  Yes Lucrezia Starch, MD  ?Nutritional Supplements (ESTROVEN PO) Take 1 tablet by mouth daily.   Yes [provider]  ?pantoprazole (PROTONIX) 40 MG tablet Take 40 mg by mouth daily before breakfast. 09/19/19  Yes [provider]  ?predniSONE (STERAPRED UNI-PAK 21 TAB) 10 MG (21) TBPK tablet Take by mouth daily. Take 6 tabs by mouth daily  for 1 day, then 5 tabs for 1 day, then 4 tabs for 1 day, then 3 tabs for 1 day, 2 tabs for 1 day, then 1 tab by mouth daily for 1 day 05/08/21  Yes Lucrezia Starch, MD  ?  traMADol (ULTRAM) 50 MG tablet Take 50 mg by mouth every 6 (six) hours as needed (for pain).   Yes [provider]  ?vitamin B-12 (CYANOCOBALAMIN) 1000 MCG tablet Take 1,000 mcg by mouth daily.   Yes [provider]  ?Zinc 50 MG TABS Take 50 mg by mouth daily.   Yes [provider]  ?loratadine (CLARITIN) 10 MG tablet Take 1 tablet (10 mg total) by mouth daily. ?Patient not taking: Reported on 05/08/2021 02/10/21   Jonetta Osgood, MD  ?   ? ?Allergies    ?Triamcinolone   ? ?Review of Systems   ?Review of Systems  ?Constitutional:  Negative for chills and fever.  ?HENT:  Negative for ear pain and sore throat.   ?Eyes:   Negative for pain and visual disturbance.  ?Respiratory:  Negative for cough and shortness of breath.   ?Cardiovascular:  Negative for chest pain and palpitations.  ?Gastrointestinal:  Negative for abdominal pain and vomiting.  ?Genitourinary:  Negative for dysuria and hematuria.  ?Musculoskeletal:  Positive for back pain. Negative for arthralgias.  ?Skin:  Negative for color change and rash.  ?Neurological:  Negative for seizures and syncope.  ?All other systems reviewed and are negative. ? ?Physical Exam ?Updated Vital Signs ?BP (!) 124/99   Pulse 68   Temp 98 ?F (36.7 ?C)   Resp 15   SpO2 100%  ?Physical Exam ?Vitals and nursing note reviewed.  ?Constitutional:   ?   General: She is not in acute distress. ?   Appearance: She is well-developed.  ?HENT:  ?   Head: Normocephalic and atraumatic.  ?Eyes:  ?   Conjunctiva/sclera: Conjunctivae normal.  ?Cardiovascular:  ?   Rate and Rhythm: Normal rate and regular rhythm.  ?   Heart sounds: No murmur heard. ?Pulmonary:  ?   Effort: Pulmonary effort is normal. No respiratory distress.  ?   Breath sounds: Normal breath sounds.  ?Abdominal:  ?   Palpations: Abdomen is soft.  ?   Tenderness: There is no abdominal tenderness.  ?Musculoskeletal:  ?   Cervical back: Neck supple.  ?   Comments: There is some tenderness to the lumbar sacral region, no step-off or deformity noted  ?Skin: ?   General: Skin is warm and dry.  ?   Capillary Refill: Capillary refill takes less than 2 seconds.  ?Neurological:  ?   Mental Status: She is alert.  ?   Comments: Normal strength and sensation in her lower extremities  ?Psychiatric:     ?   Mood and Affect: Mood normal.  ? ? ?ED Results / Procedures / Treatments   ?Labs ?(all labs ordered are listed, but only abnormal results are displayed) ?Labs Reviewed - No data to display ? ?EKG ?None ? ?Radiology ?DG Lumbar Spine Complete ? ?Result Date: 05/08/2021 ?CLINICAL DATA:  Lower back pain for several weeks without known injury. EXAM: LUMBAR  SPINE - COMPLETE 4+ VIEW COMPARISON:  None Available. FINDINGS: Minimal grade 1 anterolisthesis of L4-5 is noted secondary to posterior facet joint hypertrophy. Mild degenerative disc disease is noted at L1-2 with anterior osteophyte formation. No fracture is noted. IMPRESSION: Mild degenerative changes as described above. No acute abnormality seen. Electronically Signed   By: Marijo Conception M.D.   On: 05/08/2021 11:51  ? ?DG Sacrum/Coccyx ? ?Result Date: 05/08/2021 ?CLINICAL DATA:  Low back pain for several weeks without known injury. EXAM: SACRUM AND COCCYX - 2+ VIEW COMPARISON:  None Available. FINDINGS: There is  no evidence of fracture or other focal bone lesions. IMPRESSION: Negative. Electronically Signed   By: Marijo Conception M.D.   On: 05/08/2021 11:54   ? ?Procedures ?Procedures  ? ? ?Medications Ordered in ED ?Medications  ?dexamethasone (DECADRON) injection 10 mg (10 mg Intravenous Patient Refused/Not Given 05/08/21 1623)  ?ketorolac (TORADOL) 15 MG/ML injection 15 mg (15 mg Intramuscular Patient Refused/Not Given 05/08/21 1623)  ? ? ?ED Course/ Medical Decision Making/ A&P ?  ?                        ?Medical Decision Making ?Risk ?Prescription drug management. ? ? ?61 year old lady presents to ER due to concern for low back pain.  She does describe pain that radiates to her right leg.  She appears well.  She is neurovascularly intact.  Based on symptomatology and exam suspect sciatica.  Given the well appearance and reassuring exam, feel she can be discharged and managed in the outpatient setting.  X-rays were obtained and I independently reviewed.  No fracture or dislocation.  Some degenerative changes noted.  Advised following up with her PCP, physical therapy as previously recommended by PCP, neurosurgery.  Will give Rx for another trial of steroid taper.  Additionally muscle relaxers as needed.  Reviewed return precautions and discharged. Pain well controlled in ER after toradol.  ? ? ? ?After the  discussed management above, the patient was determined to be safe for discharge.  The patient was in agreement with this plan and all questions regarding their care were answered.  ED return precautions were discusse

## 2021-05-08 NOTE — ED Triage Notes (Signed)
Patient complains of lower back, sacral, and right leg pain that started three weeks ago. Patient states pain has been getting progressively worse since it started. Patient denies any new urinary nor stool incontinence. Patient is alert, oriented, ambulatory, and in no apparent distress at this time. ?

## 2021-05-15 ENCOUNTER — Ambulatory Visit: Payer: Self-pay | Admitting: Surgery

## 2021-05-15 ENCOUNTER — Ambulatory Visit (INDEPENDENT_AMBULATORY_CARE_PROVIDER_SITE_OTHER): Payer: Medicare Other | Admitting: Physician Assistant

## 2021-05-15 ENCOUNTER — Encounter: Payer: Self-pay | Admitting: Physician Assistant

## 2021-05-15 DIAGNOSIS — M5440 Lumbago with sciatica, unspecified side: Secondary | ICD-10-CM

## 2021-05-15 DIAGNOSIS — M545 Low back pain, unspecified: Secondary | ICD-10-CM | POA: Insufficient documentation

## 2021-05-15 NOTE — Progress Notes (Signed)
? ?Office Visit Note ?  ?Patient: Amber Maldonado           ?Date of Birth: [birth year]           ?MRN: [id number] ?Visit Date: 05/15/2021 ?             ?Requested by: [name], MD ?[address] ?[place],  [place] [id number] ?PCP: [name], MD ? ?Chief Complaint  ?Patient presents with  ? Right Leg - Pain  ? Lower Back - Pain  ? ? ? ? ?HPI: ?Patient is a pleasant 61 year old woman with a 6-week history of back pain.  She denies any injuries.  The pain is in her bilateral posterior buttocks then radiates with severe burning onto the anterior thighs bilaterally.  Does not go below her knees.  She has a history approximately 13 years ago of some back difficulties.  At that time she was being worked up for hip pain and she eventually had an excision of the greater trochanteric bursa.  She denies any loss of bowel or bladder control.  The pain was significant enough that her primary care provider placed her on a Medrol Dosepak.  She did get some relief but as soon as the pack was over all of her symptoms returned.  She was placed on another second round of a Medrol Dosepak.  Again pain went away but only temporarily and came back just as bad.  She is concerned about continued oral prednisone for health reasons and she also has a history of osteoporosis.  Pain was bad enough that she went to the emergency room where she was given hydrocodone.  The only way she can control the pain is by taking hydrocodone every 4 hours. ? ?Assessment & Plan: ?Visit Diagnoses:  ?1. Acute right-sided low back pain with sciatica, sciatica laterality unspecified   ? ? ?Plan: Given the severity of her symptoms for the last 6 weeks and the fact that she is got no relief leaf from a Medrol Dosepak's and is now having to take hydrocodone to control her pain I recommend an MRI she can follow-up afterwards.  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  Her  sacrum also has angulation at the distal coccyx though she does not seem to be tender there ? ?Follow-Up Instructions: after mri ? ?Ortho Exam ? ?Patient is alert, oriented, no adenopathy, well-dressed, normal affect, normal respiratory effort. ?Examination patient has no particular tenderness to palpation but some achiness in the both bilateral buttock area.  She has 5 out of 5 strength with resisted dorsiflexion and plantarflexion of her ankles flexion and extension of her legs and flexion of her hips.  She has mild straight leg raise referring to the back on the right ? ?Imaging: ?No results found. ?No images are attached to the encounter. ? ?Labs: ?Lab Results  ?Component Value Date  ? HGBA1C 5.1 02/06/2021  ? CRP 22.7 (H) 02/09/2021  ? CRP 31.0 (H) 02/08/2021  ? CRP 23.5 (H) 02/07/2021  ? REPTSTATUS 02/11/2021 FINAL 02/06/2021  ? CULT  02/06/2021  ?  NO GROWTH 5 DAYS ?Performed at [facility], [place] [address], [place], [place] [address] ?  ? LABORGA ESCHERICHIA COLI (A) 02/06/2021  ? ? ? ?Lab Results  ?Component Value Date  ? ALBUMIN 2.4 (L) 02/09/2021  ? ALBUMIN 2.5 (L) 02/08/2021  ? ALBUMIN 2.5 (L) 02/07/2021  ? ? ?Lab  Results  ?Component Value Date  ? MG 2.0 02/09/2021  ? MG 1.6 (L) 02/08/2021  ? MG 2.0 02/07/2021  ? ?No results found for: VD25OH ? ?No results found for: PREALBUMIN ? ?  Latest Ref Rng & Units 02/09/2021  ?  1:11 AM 02/08/2021  ?  2:33 AM 02/07/2021  ?  4:22 AM  ?CBC EXTENDED  ?WBC 4.0 - 10.5 K/uL 7.1   11.0   17.2    ?RBC 3.87 - 5.11 MIL/uL 3.61   3.54   3.52    ?Hemoglobin 12.0 - 15.0 g/dL 10.7   10.4   10.7    ?HCT 36.0 - 46.0 % 32.8   32.5   32.5    ?Platelets 150 - 400 K/uL 227   199   217    ?NEUT# 1.7 - 7.7 K/uL 4.3   8.4   12.8    ?Lymph# 0.7 - 4.0 K/uL 1.6   1.4   2.0    ? ? ? ?There is no height or weight on file to calculate BMI. ? ?Orders:  ?Orders Placed This Encounter  ?Procedures  ? MR Lumbar Spine w/o contrast  ? ?No orders of the defined types were placed in this  encounter. ? ? ? Procedures: ?No procedures performed ? ?Clinical Data: ?No additional findings. ? ?ROS: ? ?All other systems negative, except as noted in the HPI. ?Review of Systems  ?All other systems reviewed and are negative. ? ?Objective: ?Vital Signs: There were no vitals taken for this visit. ? ?Specialty Comments:  ?No specialty comments available. ? ?PMFS History: ?Patient Active Problem List  ? Diagnosis Date Noted  ? Right thyroid nodule 02/07/2021  ? Fibromyalgia 02/07/2021  ? Vitamin B12 deficiency 02/07/2021  ? GERD (gastroesophageal reflux disease) 02/07/2021  ? COVID-19 virus infection 02/06/2021  ? Hypomagnesemia 02/06/2021  ? Hypokalemia 02/06/2021  ? Sepsis due to acute pyelonephritis 02/06/2021  ? Sepsis ([place]) 02/06/2021  ? Palpitations 04/12/2020  ? Aortic atherosclerosis ([place]) 04/12/2020  ? SVT (supraventricular tachycardia) ([place]) 02/01/2020  ? Essential hypertension 02/01/2020  ? ?Past Medical History:  ?Diagnosis Date  ? Hypertension   ? Irritable bowel disease   ? SVT (supraventricular tachycardia) ([place])   ?  ?No family history on file.  ?Past Surgical History:  ?Procedure Laterality Date  ? KNEE ARTHROPLASTY    ? ?Social History  ? ?Occupational History  ? Not on file  ?Tobacco Use  ? Smoking status: Former  ? Smokeless tobacco: Never  ?Substance and Sexual Activity  ? Alcohol use: Yes  ?  Comment: occassional  ? Drug use: Never  ? Sexual activity: Not Currently  ? ? ? ? ? ?

## 2021-05-28 ENCOUNTER — Ambulatory Visit
Admission: RE | Admit: 2021-05-28 | Discharge: 2021-05-28 | Disposition: A | Discharge status: Home / Self Care | Payer: Medicare Other | Source: Ambulatory Visit | Attending: Physician Assistant | Admitting: Physician Assistant

## 2021-05-28 DIAGNOSIS — M5440 Lumbago with sciatica, unspecified side: Secondary | ICD-10-CM | POA: Diagnosis present

## 2021-05-29 ENCOUNTER — Ambulatory Visit: Payer: Medicare Other

## 2021-06-03 ENCOUNTER — Telehealth: Payer: Self-pay | Admitting: Radiology

## 2021-06-03 ENCOUNTER — Encounter: Payer: Self-pay | Admitting: Orthopaedic Surgery

## 2021-06-03 ENCOUNTER — Other Ambulatory Visit: Payer: Self-pay | Admitting: Physician Assistant

## 2021-06-03 ENCOUNTER — Ambulatory Visit (INDEPENDENT_AMBULATORY_CARE_PROVIDER_SITE_OTHER): Payer: Medicare Other | Admitting: Orthopaedic Surgery

## 2021-06-03 ENCOUNTER — Telehealth: Payer: Self-pay | Admitting: Orthopaedic Surgery

## 2021-06-03 DIAGNOSIS — M5440 Lumbago with sciatica, unspecified side: Secondary | ICD-10-CM

## 2021-06-03 MED ORDER — HYDROCODONE-ACETAMINOPHEN 5-325 MG PO TABS: 1.0000 | ORAL_TABLET | Freq: Four times a day (QID) | ORAL | 0 refills | Status: DC | PRN

## 2021-06-03 NOTE — Telephone Encounter (Signed)
I called patient to offer work in appointment with Dr. Lorin Mercy tomorrow morning at 10am for back pain per Dr. Durward Fortes. Phone picked up, but could not hear anyone speaking.

## 2021-06-03 NOTE — Progress Notes (Signed)
Office Visit Note   Patient: Amber Maldonado           Date of Birth: 1960-04-30           MRN: [id number] Visit Date: 06/03/2021              Requested by: [name], MD [place],  [place] [address] PCP: [name], MD   Assessment & Plan: Visit Diagnoses:  1. Acute right-sided low back pain with sciatica, sciatica laterality unspecified     Plan: [name] is a pleasant 61 year old woman with a 30-monthhistory of lower back pain.  This radiates more on the right than the left into her thighs both anteriorly and posteriorly.  She has been on 2 Medrol Dosepak's which gave her relief when she was taking them but then the pain returned.  She is currently alternating tramadol and hydrocodone for pain relief.  She reports a history of back pain years ago but has not had much trouble recently.  No history of injections or spine surgeries.  No loss of bowel or bladder control.  Because of the poor relief in her symptoms despite conservative care an MRI was ordered.  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  Associated severe spinal stenosis.  Patient was discussed with Dr. NErnestina Patchesas well as the images reviewed.  He is concerned that simply aspirating the cyst will not relieve her symptoms but be willing to do this but think she would do best seeing one of our spine surgeons first as soon as possible.  In the meantime we will refill her hydrocodone which she tries to take sparingly.  Follow-Up Instructions: Return Refer to Dr. YLorin Mercy   Orders:  No orders of the defined types were placed in this encounter.  No orders of the defined types were placed in this encounter.     Procedures: No procedures performed   Clinical Data: No additional findings.   Subjective: Chief Complaint  Patient presents with   Lower Back - Follow-up    MRI review  Patient presents today for  follow up on her lower back. She had an MRI and is here today for those results.  No change in symptoms.  Predominately having trouble with her back buttock and right lower extremity particularly when she is up and around.  Has been taking a combination of hydrocodone and Ultram for pain  HPI  Review of Systems  All other systems reviewed and are negative.   Objective: Vital Signs: There were no vitals taken for this visit.  Physical Exam Constitutional:      Appearance: Normal appearance.  Pulmonary:     Effort: Pulmonary effort is normal.  Skin:    General: Skin is warm and dry.  Neurological:     General: No focal deficit present.     Mental Status: She is alert.  Psychiatric:        Mood and Affect: Mood normal.        Behavior: Behavior normal.    Ortho Exam Examination of her lower back she does shift weight when she is sitting is sitting in 1 position makes her more comfortable.  Her strength is still 5 out of 5 and her sensation is intact.  She does have more painful symptoms that go down the right greater than the left side.  Straight leg raise  negative.  No localized areas of tenderness about either thigh or hip.  Motor exam intact reflexes were symmetrical.  No percussible tenderness of the flanks or lumbar spine Specialty Comments:  No specialty comments available.  Imaging: No results found.   PMFS History: Patient Active Problem List   Diagnosis Date Noted   Low back pain 05/15/2021   Right thyroid nodule 02/07/2021   Fibromyalgia 02/07/2021   Vitamin B12 deficiency 02/07/2021   GERD (gastroesophageal reflux disease) 02/07/2021   COVID-19 virus infection 02/06/2021   Hypomagnesemia 02/06/2021   Hypokalemia 02/06/2021   Sepsis due to acute pyelonephritis 02/06/2021   Sepsis ([place]) 02/06/2021   Palpitations 04/12/2020   Aortic atherosclerosis ([place]) 04/12/2020   SVT (supraventricular tachycardia) ([place]) 02/01/2020   Essential hypertension 02/01/2020    Past Medical History:  Diagnosis Date   Hypertension    Irritable bowel disease    SVT (supraventricular tachycardia) ([place])     History reviewed. No pertinent family history.  Past Surgical History:  Procedure Laterality Date   KNEE ARTHROPLASTY     Social History   Occupational History   Not on file  Tobacco Use   Smoking status: Former   Smokeless tobacco: Never  Substance and Sexual Activity   Alcohol use: Yes    Comment: occassional   Drug use: Never   Sexual activity: Not Currently

## 2021-06-03 NOTE — Telephone Encounter (Signed)
Pt called stating that she received a call from someone from our office. Pt had an appt today with Dr. Durward Fortes and states someone Dr Durward Fortes work with will call. Pt acc is not noted. Please call pt back about this matter at [phone]

## 2021-06-26 NOTE — Patient Instructions (Signed)
DUE TO COVID-19 ONLY TWO VISITORS  (aged 61 and older)  IS ALLOWED TO COME WITH YOU AND STAY IN THE WAITING ROOM ONLY DURING PRE OP AND PROCEDURE.   **NO VISITORS ARE ALLOWED IN THE SHORT STAY AREA OR RECOVERY ROOM!!**  IF YOU WILL BE ADMITTED INTO THE HOSPITAL YOU ARE ALLOWED ONLY FOUR SUPPORT PEOPLE DURING VISITATION HOURS ONLY (7 AM -8PM)   The support person(s) must pass our screening, gel in and out Visitors GUEST BADGE MUST BE WORN VISIBLY  One adult visitor may remain with you overnight and MUST be in the room by 8 P.M.   You are not required to [employer] often Do NOT share personal items Notify your provider if you are in close contact with someone who has COVID or you develop fever 100.4 or greater, new onset of sneezing, cough, sore throat, shortness of breath or body aches.        Your procedure is scheduled on:  07-10-21   Report to [facility] Main Entrance    Report to admitting at 5:15 AM   Call this number if you have problems the morning of surgery [phone]   Do not eat food :After Midnight the night before surgery   After Midnight you may have the following liquids until 4:30 AM DAY OF SURGERY  Clear Liquid Diet Water Black Coffee (sugar ok, NO MILK/CREAM OR CREAMERS)  Tea (sugar ok, NO MILK/CREAM OR CREAMERS) regular and decaf                             Plain Jell-O (NO RED)                                           Fruit ices (not with fruit pulp, NO RED)                                     Popsicles (NO RED)                                                                  Juice: apple, WHITE grape, WHITE cranberry Sports drinks like Gatorade (NO RED) Clear broth(vegetable,chicken,beef)                       If you have questions, please contact your surgeon's office.   FOLLOW ANY ADDITIONAL PRE OP INSTRUCTIONS YOU RECEIVED FROM YOUR SURGEON'S OFFICE!!!     Oral Hygiene is also important to reduce your risk of infection.                                     Remember - BRUSH YOUR TEETH THE MORNING OF SURGERY WITH YOUR REGULAR TOOTHPASTE   Do NOT smoke after Midnight   Take these medicines the morning of surgery with A SIP OF WATER:  Gabapentin, Pantoprazole.  Okay to use Tramadol or Hydrocodone   DO NOT [facility]  MEDICATIONS TO [place]. PHARMACY WILL DISPENSE MEDICATIONS LISTED ON YOUR MEDICATION LIST TO YOU DURING YOUR ADMISSION [place]!                         You may not have any metal on your body including hair pins, jewelry, and body piercing             Do not wear make-up, lotions, powders, perfumes/cologne, or deodorant  Do not wear nail polish including gel and S&S, artificial/acrylic nails, or any other type of covering on natural nails including finger and toenails. If you have artificial nails, gel coating, etc. that needs to be removed by a nail salon please have this removed prior to surgery or surgery may need to be canceled/ delayed if the surgeon/ anesthesia feels like they are unable to be safely monitored.   Do not shave  48 hours prior to surgery.    Contacts, dentures or bridgework may not be worn into surgery.   Bring small overnight bag day of surgery.  Do not bring valuables to the hospital. [place].   Special Instructions: Bring a copy of your healthcare power of attorney and living will documents the day of surgery if you haven't scanned them before.  Please read over the following fact sheets you were given: IF YOU HAVE QUESTIONS ABOUT YOUR PRE-OP INSTRUCTIONS PLEASE CALL [facility] - Preparing for Surgery Before surgery, you can play an important role.  Because skin is not sterile, your skin needs to be as free of germs as possible.  You can reduce the number of germs on your skin by washing with CHG (chlorahexidine gluconate) soap before surgery.  CHG is an antiseptic cleaner which kills germs and bonds  with the skin to continue killing germs even after washing. Please DO NOT use if you have an allergy to CHG or antibacterial soaps.  If your skin becomes reddened/irritated stop using the CHG and inform your nurse when you arrive at Short Stay. Do not shave (including legs and underarms) for at least 48 hours prior to the first CHG shower.  You may shave your face/neck.  Please follow these instructions carefully:  1.  Shower with CHG Soap the night before surgery and the  morning of surgery.  2.  If you choose to wash your hair, wash your hair first as usual with your normal  shampoo.  3.  After you shampoo, rinse your hair and body thoroughly to remove the shampoo.                             4.  Use CHG as you would any other liquid soap.  You can apply chg directly to the skin and wash.  Gently with a scrungie or clean washcloth.  5.  Apply the CHG Soap to your body ONLY FROM THE NECK DOWN.   Do   not use on face/ open                           Wound or open sores. Avoid contact with eyes, ears mouth and   genitals (private parts).                       Wash face,  Genitals (private parts) with your normal  soap.             6.  Wash thoroughly, paying special attention to the area where your    surgery  will be performed.  7.  Thoroughly rinse your body with warm water from the neck down.  8.  DO NOT shower/wash with your normal soap after using and rinsing off the CHG Soap.                9.  Pat yourself dry with a clean towel.            10.  Wear clean pajamas.            11.  Place clean sheets on your bed the night of your first shower and do not  sleep with pets. Day of Surgery : Do not apply any lotions/deodorants the morning of surgery.  Please wear clean clothes to the hospital/surgery center.  FAILURE TO FOLLOW THESE INSTRUCTIONS MAY RESULT IN THE CANCELLATION OF YOUR SURGERY  PATIENT SIGNATURE_________________________________  NURSE  SIGNATURE__________________________________  ________________________________________________________________________

## 2021-07-01 ENCOUNTER — Ambulatory Visit: Payer: Self-pay

## 2021-07-01 ENCOUNTER — Ambulatory Visit (INDEPENDENT_AMBULATORY_CARE_PROVIDER_SITE_OTHER): Payer: Medicare Other | Admitting: Orthopaedic Surgery

## 2021-07-01 ENCOUNTER — Telehealth: Payer: Self-pay

## 2021-07-01 ENCOUNTER — Encounter: Payer: Self-pay | Admitting: Orthopaedic Surgery

## 2021-07-01 VITALS — BP 115/78 | HR 84 | Ht 60.0 in | Wt 163.4 lb

## 2021-07-01 DIAGNOSIS — M5440 Lumbago with sciatica, unspecified side: Secondary | ICD-10-CM

## 2021-07-01 DIAGNOSIS — M48062 Spinal stenosis, lumbar region with neurogenic claudication: Secondary | ICD-10-CM

## 2021-07-01 NOTE — Progress Notes (Signed)
Office Visit Note   Patient: Amber Maldonado           Date of Birth: 1960/07/24           MRN: [id number] Visit Date: 07/01/2021              Requested by: Amber Keens, MD [place],  [place] [address] PCP: Amber Keens, MD   Assessment & Plan: Visit Diagnoses:  1. Acute right-sided low back pain with sciatica, sciatica laterality unspecified   2. Spinal stenosis of lumbar region with neurogenic claudication     Plan: Discussed patient single level decompression with removal of large intra spinal extradural facet cyst causing severe compression.  Normally she would just die overnight.  Risks of dural tear with the cyst adherent to the dura discussed.  Risks of infection, progression of degeneration, potential for need for fusion if she gets cyst recurrence.  Questions elicited and answered.  Chart review shows she had a history of SVT January 2022 and she will require cardiac clearance.  No chest pain or heart irregularity since that time.  I reviewed patient's MRI scan only reviewed surgical treatment.  She agrees to proceed.  Follow-Up Instructions: No follow-ups on file.   Orders:  Orders Placed This Encounter  Procedures   XR Lumb Spine Flex&Ext Only   No orders of the defined types were placed in this encounter.     Procedures: No procedures performed   Clinical Data: No additional findings.   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  Patient sent to me by Dr. [name] stenosis with intraspinal extradural large facet cyst with severe mass effect.  She is having to take some tramadol and hydrocodone alternately to help with the pain.  MRI scan showed 15 mm intraspinal extradural mass at L3-4 consistent with the right synovial cyst with severe  compression.  Patient presents for discussion of a laminectomy for decompression and removal of the cyst.  Patient is continuing to work.  Patient gets relief with sitting, no problems if she uses a grocery cart.  Increased pain if she is in standing position for more than 5 minutes.  At times she cannot get relief in sitting position sometimes has to lay down.  Patient's been on gabapentin.  Still having to use some tramadol and hydrocodone intermittently for the pain.  Review of Systems past history of pyelonephritis no current symptoms.  Positive history of SVT and hypertension.   Objective: Vital Signs: BP 115/78   Pulse 84   Ht 5' (1.524 m)   Wt 163 lb 6.4 oz (74.1 kg)   BMI 31.91 kg/m   Physical Exam Constitutional:      Appearance: She is well-developed.  HENT:     Head: Normocephalic.     Right Ear: External ear normal.     Left Ear: External ear normal. There is no impacted cerumen.  Eyes:     Pupils: Pupils are equal, round, and reactive to light.  Neck:     Thyroid: No thyromegaly.     Trachea: No tracheal deviation.  Cardiovascular:     Rate and Rhythm: Normal rate.  Pulmonary:     Effort: Pulmonary effort is normal.  Abdominal:     Palpations: Abdomen is soft.  Musculoskeletal:  Cervical back: No rigidity.  Skin:    General: Skin is warm and dry.  Neurological:     Mental Status: She is alert and oriented to person, place, and time.  Psychiatric:        Behavior: Behavior normal.     Ortho Exam knee and ankle jerk are intact negative popliteal compression test.  Pain with straight leg raising 90 degrees both right and left sciatic notch tenderness right and left.  Anterior tib EHL peroneals gastrocsoleus is strong no atrophy.  Pedal pulses are normal.  Specialty Comments:  No specialty comments available.  Imaging:CLINICAL DATA:  Lumbar radiculopathy with symptoms persisting over 6 weeks of treatment   EXAM: MRI LUMBAR SPINE WITHOUT CONTRAST    TECHNIQUE: Multiplanar, multisequence MR imaging of the lumbar spine was performed. No intravenous contrast was administered.   COMPARISON:  None Available.   FINDINGS: Segmentation:  5 lumbar type vertebrae   Alignment:  Mild L4-5 anterolisthesis   Vertebrae:  No fracture, evidence of discitis, or bone lesion.   Conus medullaris and cauda equina: Conus extends to the T12-L1 level. Conus and cauda equina appear normal.   Paraspinal and other soft tissues: Negative for perispinal mass or inflammation.   Disc levels:   T12- L1: Unremarkable.   L1-L2: Disc narrowing and bulging with small central protrusion.   L2-L3: Unremarkable.   L3-L4: T2 hyperintense mass contiguous with the medial right facet, filling the spinal canal on the right with marked mass effect on the thecal sac and traversing nerve roots, up to 15 mm in diameter. Degenerative facet spurring on both sides.   L4-L5: Facet osteoarthritis with spurring and mild anterolisthesis. The disc is narrowed and bulging with moderate spinal stenosis. Either L5 nerve root could be affected in the subarticular recesses.   L5-S1:Degenerative facet spurring on both sides.   IMPRESSION: 1. 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 Associated severe spinal canal stenosis. 2. L4-5 notable facet osteoarthritis with anterolisthesis and moderate spinal stenosis. Either L5 nerve root could be affected in the subarticular recesses.     Electronically Signed   By: Amber Maldonado M.D.   On: 05/28/2021 13:28    PMFS History: Patient Active Problem List   Diagnosis Date Noted   Spinal stenosis of lumbar region 07/02/2021   Low back pain 05/15/2021   Right thyroid nodule 02/07/2021   Fibromyalgia 02/07/2021   Vitamin B12 deficiency 02/07/2021   GERD (gastroesophageal reflux disease) 02/07/2021   COVID-19 virus infection 02/06/2021   Hypomagnesemia 02/06/2021   Hypokalemia  02/06/2021   Sepsis due to acute pyelonephritis 02/06/2021   Sepsis ([place]) 02/06/2021   Palpitations 04/12/2020   Aortic atherosclerosis ([place]) 04/12/2020   SVT (supraventricular tachycardia) ([name]) 02/01/2020   Essential hypertension 02/01/2020   Past Medical History:  Diagnosis Date   Hypertension    Irritable bowel disease    SVT (supraventricular tachycardia) ([place])     History reviewed. No pertinent family history.  Past Surgical History:  Procedure Laterality Date   KNEE ARTHROPLASTY     Social History   Occupational History   Not on file  Tobacco Use   Smoking status: Former   Smokeless tobacco: Never  Substance and Sexual Activity   Alcohol use: Yes    Comment: occassional   Drug use: Never   Sexual activity: Not Currently

## 2021-07-02 DIAGNOSIS — M48061 Spinal stenosis, lumbar region without neurogenic claudication: Secondary | ICD-10-CM | POA: Insufficient documentation

## 2021-07-03 ENCOUNTER — Other Ambulatory Visit: Payer: Self-pay

## 2021-07-03 ENCOUNTER — Encounter (HOSPITAL_COMMUNITY)
Admission: RE | Admit: 2021-07-03 | Discharge: 2021-07-03 | Disposition: A | Discharge status: Home / Self Care | Payer: Medicare Other | Source: Ambulatory Visit | Attending: Anesthesiology | Admitting: Anesthesiology

## 2021-07-03 DIAGNOSIS — Z01818 Encounter for other preprocedural examination: Secondary | ICD-10-CM

## 2021-07-03 DIAGNOSIS — I2510 Atherosclerotic heart disease of native coronary artery without angina pectoris: Secondary | ICD-10-CM

## 2021-07-04 ENCOUNTER — Telehealth: Payer: Self-pay | Admitting: *Deleted

## 2021-07-04 NOTE — Telephone Encounter (Signed)
Pt has an appointment scheduled already, 07/17/21 for follow-up.  I have edited the appt notes for preop clearance.       Pre-operative Risk Assessment    Patient Name: Amber Maldonado  DOB: 1960-11-09 MRN: [id number]      Request for Surgical Clearance    Procedure:   LUMBAR DECOMPRESSION  Date of Surgery:  Clearance 07/30/21                                 Surgeon:  DR. MARK YATES Surgeon's Group or Practice Name:  [facility] Phone number:  [phone] Fax number:  [phone]   Type of Clearance Requested:   - Medical    Type of Anesthesia:  General    Additional requests/questions:    Astrid Divine   07/04/2021, 7:34 AM

## 2021-07-09 ENCOUNTER — Ambulatory Visit: Payer: Medicare Other | Admitting: Physician Assistant

## 2021-07-16 NOTE — Progress Notes (Addendum)
Spoke with Amber Maldonado at Dr. Tera Helper office just to make sure he was aware that Amber Maldonado was having back surgery with Dr. Lorin Mercy on 07/30/21.

## 2021-07-17 ENCOUNTER — Ambulatory Visit (INDEPENDENT_AMBULATORY_CARE_PROVIDER_SITE_OTHER): Payer: Medicare Other | Admitting: Physician Assistant

## 2021-07-17 ENCOUNTER — Encounter: Payer: Self-pay | Admitting: Physician Assistant

## 2021-07-17 VITALS — BP 122/78 | HR 81 | Ht 60.0 in | Wt 161.0 lb

## 2021-07-17 DIAGNOSIS — I700 Atherosclerosis of aorta: Secondary | ICD-10-CM | POA: Diagnosis not present

## 2021-07-17 DIAGNOSIS — I10 Essential (primary) hypertension: Secondary | ICD-10-CM | POA: Diagnosis not present

## 2021-07-17 DIAGNOSIS — I471 Supraventricular tachycardia: Secondary | ICD-10-CM

## 2021-07-17 MED ORDER — DILTIAZEM HCL 30 MG PO TABS: 30.0000 mg | ORAL_TABLET | ORAL | 2 refills | Status: DC | PRN

## 2021-07-17 NOTE — Progress Notes (Signed)
Cardiology Office Note:    Date:  07/17/2021   ID:  Amber Maldonado, DOB [birth year], MRN [id number]  PCP:  Amber Keens, MD  [facility] Cardiologist:  Amber Lean, MD  [facility] Electrophysiologist:  None   Chief Complaint: surgical clearance for LUMBAR DECOMPRESSION  History of Present Illness:    Amber Maldonado is a 61 y.o. female with a hx of HTN, aortic atherosclerosis on CTA of chest and SVT seen for surgical clearance.  Cardiac Event Monitoring:01/01/20 Patient had a minimum heart rate of 50 bpm, maximum heart rate of 147 bpm, and average heart rate of 78 bpm. Predominant underlying rhythm was sinus rhythm. Isolated PACs were rare (<1.0%). Isolated PVCs were rare (<1.0%). No evidence of atrial fibrillation. Triggered and diary events associated with sinus rhythm and sinus tachycardia.   No malignant arrhythmias.  Seen today for surgical clearance.  Patient reports he is scheduled to have back surgery in January and then thyroidectomy for enlarging thyroid.  She works as a Nurse, learning disability.  No exertional chest pain or shortness of breath.  She denies shortness of breath, orthopnea, PND, syncope, lower extremity edema, dizziness, palpitation or melena.    Most recent lipid panel in January 2023 Total cholesterol 178 LDL 83 Triglyceride 150 HDL 73   Past Medical History:  Diagnosis Date   Hypertension    Irritable bowel disease    SVT (supraventricular tachycardia) (HCC)     Past Surgical History:  Procedure Laterality Date   KNEE ARTHROPLASTY      Current Medications: Current Meds  Medication Sig   acetaminophen (TYLENOL) 650 MG CR tablet Take 1,300 mg by mouth in the morning and at bedtime.   alendronate (FOSAMAX) 70 MG tablet Take 70 mg by mouth every Friday. Take with a full glass of water on an empty stomach.   amoxicillin (AMOXIL) 500 MG capsule Take 2,000 mg by mouth See admin instructions. Take 2,000 mg by mouth two hours before  dental appointments   Ascorbic Acid (VITAMIN C) 1000 MG tablet Take 1,000 mg by mouth every evening.   calcium carbonate (OSCAL) 1500 (600 Ca) MG TABS tablet Take 600 mg of elemental calcium by mouth 2 (two) times daily.   celecoxib (CELEBREX) 200 MG capsule Take 200 mg by mouth daily.   cholecalciferol (VITAMIN D) 25 MCG (1000 UNIT) tablet Take 3,000 Units by mouth daily.   cyanocobalamin (,VITAMIN B-12,) 1000 MCG/ML injection Inject 1,000 mcg into the muscle every 30 (thirty) days.   diltiazem (CARDIZEM) 30 MG tablet Take 1 tablet (30 mg total) by mouth as needed (palpitations).   Docusate Sodium 100 MG capsule Take 100 mg by mouth 2 (two) times daily.   gabapentin (NEURONTIN) 300 MG capsule Take 300 mg by mouth 3 (three) times daily.   lisinopril (ZESTRIL) 5 MG tablet Take 5 mg by mouth daily.   Magnesium 400 MG TABS Take 400 mg by mouth in the morning and at bedtime.   Melatonin 10 MG TABS Take 10 mg by mouth at bedtime.   Multiple Vitamin (MULTIVITAMIN WITH MINERALS) TABS tablet Take 2 tablets by mouth daily.   Nutritional Supplements (ESTROVEN PO) Take 1 tablet by mouth daily.   pantoprazole (PROTONIX) 40 MG tablet Take 40 mg by mouth daily before breakfast.   traMADol (ULTRAM) 50 MG tablet Take 50 mg by mouth every 6 (six) hours as needed (for pain).   vitamin B-12 (CYANOCOBALAMIN) 1000 MCG tablet Take 1,000 mcg by mouth daily.   Zinc 50  MG TABS Take 50 mg by mouth daily.     Allergies:   Triamcinolone   Social History   Socioeconomic History   Marital status: Legally Separated    Spouse name: Not on file   Number of children: Not on file   Years of education: Not on file   Highest education level: Not on file  Occupational History   Not on file  Tobacco Use   Smoking status: Former   Smokeless tobacco: Never  Substance and Sexual Activity   Alcohol use: Yes    Comment: occassional   Drug use: Never   Sexual activity: Not Currently  Other Topics Concern   Not on file   Social History Narrative   Not on file   Social Determinants of Health   Financial Resource Strain: Not on file  Food Insecurity: Not on file  Transportation Needs: Not on file  Physical Activity: Not on file  Stress: Not on file  Social Connections: Not on file     Family History: The patient's family history is not on file.    ROS:   Please see the history of present illness.    All other systems reviewed and are negative.   EKGs/Labs/Other Studies Reviewed:    The following studies were reviewed today: As summarized above  EKG:  EKG is  ordered today.  The ekg ordered today demonstrates sinus rhythm with nonspecific ST changes which is similar to prior  Recent Labs: 02/09/2021: ALT 15; BUN 11; Creatinine, Ser 0.61; Hemoglobin 10.7; Magnesium 2.0; Platelets 227; Potassium 3.2; Sodium 140 03/24/2021: TSH 0.87  Recent Lipid Panel No results found for: "CHOL", "TRIG", "HDL", "CHOLHDL", "VLDL", "LDLCALC", "LDLDIRECT"   Physical Exam:    VS:  BP 122/78   Pulse 81   Ht 5' (1.524 m)   Wt 161 lb (73 kg)   SpO2 97%   BMI 31.44 kg/m     Wt Readings from Last 3 Encounters:  07/17/21 161 lb (73 kg)  07/01/21 163 lb 6.4 oz (74.1 kg)  03/24/21 161 lb (73 kg)     GEN:  Well nourished, well developed in no acute distress HEENT: Normal NECK: No JVD; No carotid bruits LYMPHATICS: No lymphadenopathy CARDIAC: RRR, no murmurs, rubs, gallops RESPIRATORY:  Clear to auscultation without rales, wheezing or rhonchi  ABDOMEN: Soft, non-tender, non-distended MUSCULOSKELETAL:  No edema; No deformity  SKIN: Warm and dry NEUROLOGIC:  Alert and oriented x 3 PSYCHIATRIC:  Normal affect   ASSESSMENT AND PLAN:    SVT No recurrent episodes since history of issue.  Continue to use as needed Cardizem.  Will refill medication.  No syncope.  2.  Hypertension Blood pressure stable on current medication  3.  Surgical clearance Given past medical history and time since last visit, based  on ACC/AHA guidelines, Amber Maldonado would be at acceptable risk for the planned procedure without further cardiovascular testing. Monitor on tele during surgery. Can use Cardizem gtt for tachycardia.   The patient was advised that if she develops new symptoms prior to surgery to contact our office to arrange for a follow-up visit, and she verbalized understanding.  I will route this recommendation to the requesting party via Epic fax function and remove from pre-op pool.   Medication Adjustments/Labs and Tests Ordered: Current medicines are reviewed at length with the patient today.  Concerns regarding medicines are outlined above.  Orders Placed This Encounter  Procedures   EKG 12-Lead   Meds ordered this encounter  Medications  diltiazem (CARDIZEM) 30 MG tablet    Sig: Take 1 tablet (30 mg total) by mouth as needed (palpitations).    Dispense:  30 tablet    Refill:  2    Patient Instructions  Medication Instructions:  START Cardizem '30mg'$  take on a as needed basis for palpitations   *If you need a refill on your cardiac medications before your next appointment, please call your pharmacy*   Lab Work: None ordered   Testing/Procedures: None Ordered   Follow-Up: At [facility], you and your health needs are our priority.  As part of our continuing mission to provide you with exceptional heart care, we have created designated Provider Care Teams.  These Care Teams include your primary Cardiologist (physician) and Advanced Practice Providers (APPs -  Physician Assistants and Nurse Practitioners) who all work together to provide you with the care you need, when you need it.  We recommend signing up for the patient portal called "MyChart".  Sign up information is provided on this After Visit Summary.  MyChart is used to connect with patients for Virtual Visits (Telemedicine).  Patients are able to view lab/test results, encounter notes, upcoming appointments, etc.  Non-urgent  messages can be sent to your provider as well.   To learn more about what you can do with MyChart, go to [email or web address].    Your next appointment:   1 year(s)  The format for your next appointment:   In Person  Provider:   Werner Lean, MD     Other Instructions   Important Information About Sugar         Jarrett Soho, [place]  07/17/2021 9:34 AM    [place]

## 2021-07-17 NOTE — Patient Instructions (Signed)
Medication Instructions:  START Cardizem '30mg'$  take on a as needed basis for palpitations   *If you need a refill on your cardiac medications before your next appointment, please call your pharmacy*   Lab Work: None ordered   Testing/Procedures: None Ordered   Follow-Up: At [facility], you and your health needs are our priority.  As part of our continuing mission to provide you with exceptional heart care, we have created designated Provider Care Teams.  These Care Teams include your primary Cardiologist (physician) and Advanced Practice Providers (APPs -  Physician Assistants and Nurse Practitioners) who all work together to provide you with the care you need, when you need it.  We recommend signing up for the patient portal called "MyChart".  Sign up information is provided on this After Visit Summary.  MyChart is used to connect with patients for Virtual Visits (Telemedicine).  Patients are able to view lab/test results, encounter notes, upcoming appointments, etc.  Non-urgent messages can be sent to your provider as well.   To learn more about what you can do with MyChart, go to [email or web address].    Your next appointment:   1 year(s)  The format for your next appointment:   In Person  Provider:   Werner Lean, MD     Other Instructions   Important Information About Sugar

## 2021-07-18 ENCOUNTER — Encounter: Payer: Self-pay | Admitting: Surgery

## 2021-07-18 ENCOUNTER — Ambulatory Visit (INDEPENDENT_AMBULATORY_CARE_PROVIDER_SITE_OTHER): Payer: Medicare Other | Admitting: Surgery

## 2021-07-18 VITALS — BP 131/82 | HR 93 | Ht 60.0 in | Wt 161.0 lb

## 2021-07-18 DIAGNOSIS — M48062 Spinal stenosis, lumbar region with neurogenic claudication: Secondary | ICD-10-CM

## 2021-07-18 NOTE — Progress Notes (Signed)
61 year old white female history of L3-4 stenosis comes in for preop evaluation.  States that symptoms unchanged from previous visit.  She is wanting to proceed with L3-4 decompression and removal of intraspinal extradural facet cyst and scheduled.  Today history and physical performed.  Review of systems negative.  We received preop cardiac clearance,     Plan Surgical procedure discussed along with potential recovery time.  All questions answered and she wishes to proceed.

## 2021-07-25 NOTE — Pre-Procedure Instructions (Signed)
Surgical Instructions    Your procedure is scheduled on July 30, 2021.  Report to [facility] Main Entrance "A" at 10:15 A.M., then check in with the Admitting office.  Call this number if you have problems the morning of surgery:  [phone]   If you have any questions prior to your surgery date call [phone]: Open Monday-Friday 8am-4pm    Remember:  Do not eat after midnight the night before your surgery  You may drink clear liquids until 9:15 AM the morning of your surgery.   Clear liquids allowed are: Water, Non-Citrus Juices (without pulp), Carbonated Beverages, Clear Tea, Black Coffee Only (NO MILK, CREAM OR POWDERED CREAMER of any kind), and Gatorade.    Take these medicines the morning of surgery with A SIP OF WATER:  acetaminophen (TYLENOL)  Docusate Sodium   gabapentin (NEURONTIN)  pantoprazole (PROTONIX)   Take these medicines the morning of surgery AS NEEDED:  diltiazem (CARDIZEM)  traMADol (ULTRAM)   As of today, STOP taking any Aspirin (unless otherwise instructed by your surgeon) Aleve, Naproxen, Ibuprofen, Motrin, Advil, Goody's, BC's, all herbal medications, fish oil, and all vitamins. This includes your medication celecoxib (CELEBREX)                     Do NOT Smoke (Tobacco/Vaping) for 24 hours prior to your procedure.  If you use a CPAP at night, you may bring your mask/headgear for your overnight stay.   Contacts, glasses, piercing's, hearing aid's, dentures or partials may not be worn into surgery, please bring cases for these belongings.    For patients admitted to the hospital, discharge time will be determined by your treatment team.   Patients discharged the day of surgery will not be allowed to drive home, and someone needs to stay with them for 24 hours.  SURGICAL WAITING ROOM VISITATION Patients having surgery or a procedure may have no more than 2 support people in the waiting area - these visitors may rotate.   Children under the age of  76 must have an adult with them who is not the patient. If the patient needs to stay at the hospital during part of their recovery, the visitor guidelines for inpatient rooms apply. Pre-op nurse will coordinate an appropriate time for 1 support person to accompany patient in pre-op.  This support person may not rotate.   Please refer to the [facility] website for the visitor guidelines for Inpatients (after your surgery is over and you are in a regular room).    Special instructions:   [place]- Preparing For Surgery  Before surgery, you can play an important role. Because skin is not sterile, your skin needs to be as free of germs as possible. You can reduce the number of germs on your skin by washing with CHG (chlorahexidine gluconate) Soap before surgery.  CHG is an antiseptic cleaner which kills germs and bonds with the skin to continue killing germs even after washing.    Oral Hygiene is also important to reduce your risk of infection.  Remember - BRUSH YOUR TEETH THE MORNING OF SURGERY WITH YOUR REGULAR TOOTHPASTE  Please do not use if you have an allergy to CHG or antibacterial soaps. If your skin becomes reddened/irritated stop using the CHG.  Do not shave (including legs and underarms) for at least 48 hours prior to first CHG shower. It is OK to shave your face.  Please follow these instructions carefully.   Shower the [employer] SURGERY and  the MORNING OF SURGERY  If you chose to wash your hair, wash your hair first as usual with your normal shampoo.  After you shampoo, rinse your hair and body thoroughly to remove the shampoo.  Use CHG Soap as you would any other liquid soap. You can apply CHG directly to the skin and wash gently with a scrungie or a clean washcloth.   Apply the CHG Soap to your body ONLY FROM THE NECK DOWN.  Do not use on open wounds or open sores. Avoid contact with your eyes, ears, mouth and genitals (private parts). Wash Face and genitals (private  parts)  with your normal soap.   Wash thoroughly, paying special attention to the area where your surgery will be performed.  Thoroughly rinse your body with warm water from the neck down.  DO NOT shower/wash with your normal soap after using and rinsing off the CHG Soap.  Pat yourself dry with a CLEAN TOWEL.  Wear CLEAN PAJAMAS to bed the night before surgery  Place CLEAN SHEETS on your bed the night before your surgery  DO NOT SLEEP WITH PETS.   Day of Surgery: Take a shower with CHG soap. Do not wear jewelry or makeup Do not wear lotions, powders, perfumes/colognes, or deodorant. Do not shave 48 hours prior to surgery.  Men may shave face and neck. Do not bring valuables to the hospital.  [facility] is not responsible for any belongings or valuables. Do not wear nail polish, gel polish, artificial nails, or any other type of covering on natural nails (fingers and toes) If you have artificial nails or gel coating that need to be removed by a nail salon, please have this removed prior to surgery. Artificial nails or gel coating may interfere with anesthesia's ability to adequately monitor your vital signs.  Wear Clean/Comfortable clothing the morning of surgery  Remember to brush your teeth WITH YOUR REGULAR TOOTHPASTE.   Please read over the following fact sheets that you were given.    If you received a COVID test during your pre-op visit  it is requested that you wear a mask when out in public, stay away from anyone that may not be feeling well and notify your surgeon if you develop symptoms. If you have been in contact with anyone that has tested positive in the last 10 days please notify you surgeon.

## 2021-07-28 ENCOUNTER — Encounter (HOSPITAL_COMMUNITY)
Admission: RE | Admit: 2021-07-28 | Discharge: 2021-07-28 | Disposition: A | Discharge status: Home / Self Care | Payer: Medicare Other | Source: Ambulatory Visit | Attending: Orthopaedic Surgery | Admitting: Orthopaedic Surgery

## 2021-07-28 ENCOUNTER — Encounter (HOSPITAL_COMMUNITY): Payer: Self-pay

## 2021-07-28 ENCOUNTER — Other Ambulatory Visit: Payer: Self-pay

## 2021-07-28 VITALS — BP 122/85 | HR 79 | Temp 97.8°F | Resp 17 | Ht 60.0 in | Wt 160.3 lb

## 2021-07-28 DIAGNOSIS — E041 Nontoxic single thyroid nodule: Secondary | ICD-10-CM | POA: Diagnosis not present

## 2021-07-28 DIAGNOSIS — K219 Gastro-esophageal reflux disease without esophagitis: Secondary | ICD-10-CM | POA: Insufficient documentation

## 2021-07-28 DIAGNOSIS — M48061 Spinal stenosis, lumbar region without neurogenic claudication: Secondary | ICD-10-CM | POA: Diagnosis not present

## 2021-07-28 DIAGNOSIS — M797 Fibromyalgia: Secondary | ICD-10-CM | POA: Insufficient documentation

## 2021-07-28 DIAGNOSIS — I2510 Atherosclerotic heart disease of native coronary artery without angina pectoris: Secondary | ICD-10-CM | POA: Diagnosis not present

## 2021-07-28 DIAGNOSIS — Z79899 Other long term (current) drug therapy: Secondary | ICD-10-CM | POA: Insufficient documentation

## 2021-07-28 DIAGNOSIS — M47816 Spondylosis without myelopathy or radiculopathy, lumbar region: Secondary | ICD-10-CM | POA: Insufficient documentation

## 2021-07-28 DIAGNOSIS — Z87891 Personal history of nicotine dependence: Secondary | ICD-10-CM | POA: Insufficient documentation

## 2021-07-28 DIAGNOSIS — M4316 Spondylolisthesis, lumbar region: Secondary | ICD-10-CM | POA: Diagnosis not present

## 2021-07-28 DIAGNOSIS — Z01812 Encounter for preprocedural laboratory examination: Secondary | ICD-10-CM | POA: Insufficient documentation

## 2021-07-28 DIAGNOSIS — Z01818 Encounter for other preprocedural examination: Secondary | ICD-10-CM

## 2021-07-28 DIAGNOSIS — I10 Essential (primary) hypertension: Secondary | ICD-10-CM | POA: Insufficient documentation

## 2021-07-28 HISTORY — DX: Cardiac arrhythmia, unspecified: I49.9

## 2021-07-28 HISTORY — DX: Malignant (primary) neoplasm, unspecified: C80.1

## 2021-07-28 HISTORY — DX: Nontoxic single thyroid nodule: E04.1

## 2021-07-28 HISTORY — DX: Unspecified osteoarthritis, unspecified site: M19.90

## 2021-07-28 HISTORY — DX: Gastro-esophageal reflux disease without esophagitis: K21.9

## 2021-07-28 HISTORY — DX: Fibromyalgia: M79.7

## 2021-07-28 LAB — BASIC METABOLIC PANEL
Anion gap: 7 (ref 5–15)
BUN: 19 mg/dL (ref 6–20)
CO2: 26 mmol/L (ref 22–32)
Calcium: 9.2 mg/dL (ref 8.9–10.3)
Chloride: 109 mmol/L (ref 98–111)
Creatinine, Ser: 0.69 mg/dL (ref 0.44–1.00)
GFR, Estimated: >60 mL/min (ref >60)
Glucose, Bld: 93 mg/dL (ref 70–99)
Potassium: 3.3 mmol/L — ABNORMAL LOW (ref 3.5–5.1)
Sodium: 142 mmol/L (ref 135–145)

## 2021-07-28 LAB — CBC
HCT: 41.4 % (ref 36.0–46.0)
Hemoglobin: 13.3 g/dL (ref 12.0–15.0)
MCH: 31.2 pg (ref 26.0–34.0)
MCHC: 32.1 g/dL (ref 30.0–36.0)
MCV: 97.2 fL (ref 80.0–100.0)
Platelets: 276 10*3/uL (ref 150–400)
RBC: 4.26 MIL/uL (ref 3.87–5.11)
RDW: 13.7 % (ref 11.5–15.5)
WBC: 6.4 10*3/uL (ref 4.0–10.5)
nRBC: 0 % (ref 0.0–0.2)

## 2021-07-28 LAB — SURGICAL PCR SCREEN
MRSA, PCR: NEGATIVE
Staphylococcus aureus: NEGATIVE

## 2021-07-28 NOTE — Progress Notes (Signed)
PCP - Dr. [name]  Cardiologist - Dr. Gasper Sells  PPM/ICD - Denies Device Orders - n/a Rep Notified - n/a  Chest x-ray -  EKG - 07/17/21 Stress Test - Per patient, had one in 2007 and results were normal ECHO - denies Cardiac Cath - denies  Sleep Study - denies CPAP - n/a  No DM  Blood Thinner Instructions: n/a Aspirin Instructions: As of today, STOP taking any Aspirin.  ERAS Protcol - Clear liquids until 0915 the morning of surgery PRE-SURGERY Ensure or G2- n/a  COVID TEST- n/a   Anesthesia review: Yes. Hx of SVT in 2021 and takes PRN Cardizem if needed for increased heart rate. Has not taken any since initial incident. She has a cardiac clearance note from MD in [place]. 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 Denies any issues breathing or swallowing.   Patient denies shortness of breath, fever, cough and chest pain at PAT appointment   All instructions explained to the patient, with a verbal understanding of the material. Patient agrees to go over the instructions while at home for a better understanding. Patient also instructed to self quarantine after being tested for COVID-19. The opportunity to ask questions was provided.

## 2021-07-29 ENCOUNTER — Encounter (HOSPITAL_COMMUNITY): Payer: Self-pay

## 2021-07-29 NOTE — Anesthesia Preprocedure Evaluation (Signed)
Anesthesia Evaluation  Patient identified by MRN, date of birth, ID band Patient awake    Reviewed: Allergy & Precautions, NPO status , Patient's Chart, lab work & pertinent test results  History of Anesthesia Complications Negative for: history of anesthetic complications  Airway Mallampati: I  TM Distance: >3 FB Neck ROM: Full    Dental  (+) Teeth Intact, Dental Advisory Given   Pulmonary neg shortness of breath, neg sleep apnea, neg COPD, neg recent URI, former smoker,    breath sounds clear to auscultation       Cardiovascular hypertension, Pt. on medications + dysrhythmias Supra Ventricular Tachycardia  Rhythm:Regular     Neuro/Psych neg Seizures  Neuromuscular disease negative psych ROS   GI/Hepatic Neg liver ROS, GERD  Medicated and Controlled,  Endo/Other  negative endocrine ROS  Renal/GU Renal diseaseLab Results      Component                Value               Date                      CREATININE               0.69                07/28/2021                Musculoskeletal  (+) Arthritis , Fibromyalgia -  Abdominal   Peds  Hematology negative hematology ROS (+) Lab Results      Component                Value               Date                      WBC                      6.4                 07/28/2021                HGB                      13.3                07/28/2021                HCT                      41.4                07/28/2021                MCV                      97.2                07/28/2021                PLT                      276                 07/28/2021              Anesthesia Other Findings  Reproductive/Obstetrics                           Anesthesia Physical Anesthesia Plan  ASA: 2  Anesthesia Plan: General   Post-op Pain Management: Toradol IV (intra-op)*, Ofirmev IV (intra-op)* and Ketamine IV*   Induction: Intravenous  PONV Risk Score and  Plan: 3 and Ondansetron and Dexamethasone  Airway Management Planned: Oral ETT  Additional Equipment: None  Intra-op Plan:   Post-operative Plan: Extubation in OR  Informed Consent: I have reviewed the patients History and Physical, chart, labs and discussed the procedure including the risks, benefits and alternatives for the proposed anesthesia with the patient or authorized representative who has indicated his/her understanding and acceptance.     Dental advisory given  Plan Discussed with: CRNA  Anesthesia Plan Comments: (PAT note written 07/29/2021 by Myra Gianotti, PA-C. )       Anesthesia Quick Evaluation

## 2021-07-29 NOTE — Progress Notes (Signed)
Anesthesia Chart Review:  Case: [id number] Date/Time: 07/30/21 1200   Procedure: L3-4 DECOMPRESSION, REMOVAL OF INTRASPINAL EXTRADURAL FACET CYST   Anesthesia type: General   Pre-op diagnosis: L3-4 stenosis, facet cyst   Location: [facility] ROOM 03 / [facility]   Surgeons: [name], MD       DISCUSSION: Patient is a 61 year old female scheduled for the above procedure.  History includes former smoker (quit 01/06/92), HTN, SVT (~ 2021), GERD, fibromyalgia, melanoma (RLE), osteoarthritis (left TKA 10/30/15, revision 10/10/20). BMI is consistent with obesity.  She has a 2.9 cm right thyroid nodule by recent US. 04/02/21 right thyroid nodule FNA pathology: Consistent with benign follicular nodule (Bethesda category II). She has been evaluated by Dr. Harlow Asa and has plans for right thyroid lobectomy on 08/28/21 due to "mild compressive symptoms and describes a globus sensation." Per PAT RN, she denied difficulty swallowing or breathing. CTA of the chest on 02/06/21 showed 1.9 cm right thyroid gland nodule, no significant findings of the trachea or esophagus. She had a regional block and spinal anesthesia for left TKA revision on 10/10/20.    Last cardiology evaluation 07/17/21 by Leanor Kail, [place]. She was seen for preoperative evaluation for upcoming spinal and thyroid surgeries. No recurrent SVT (uses Cardizem as needed). No syncope, chest pain, SOB, orthopnea, PND, LE edema, dizziness, palpitations. He wrote:  "Surgical clearance Given past medical history and time since last visit, based on ACC/AHA guidelines, Dior Dominik would be at acceptable risk for the planned procedure without further cardiovascular testing. Monitor on tele during surgery. Can use Cardizem gtt for tachycardia.    The patient was advised that if she develops new symptoms prior to surgery to contact our office to arrange for a follow-up visit, and she verbalized understanding."  Anesthesia team to evaluate on the day of surgery.     VS: BP 122/85   Pulse 79   Temp 36.6 C   Resp 17   Ht 5' (1.524 m)   Wt 72.7 kg   SpO2 99%   BMI 31.31 kg/m    PROVIDERS: [name], MD is PCP  Rudean Haskell, MD is cardiologist Armandina Gemma, MD is general surgeon   LABS: Labs reviewed: Acceptable for surgery. (all labs ordered are listed, but only abnormal results are displayed)  Labs Reviewed  BASIC METABOLIC PANEL - Abnormal; Notable for the following components:      Result Value   Potassium 3.3 (*)    All other components within normal limits  SURGICAL PCR SCREEN  CBC     IMAGES: MRI L-spine 05/28/21: IMPRESSION: 1. 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 Associated severe spinal canal stenosis. 2. L4-5 notable facet osteoarthritis with anterolisthesis and moderate spinal stenosis. Either L5 nerve root could be affected in the subarticular recesses.  US Thyroid 03/27/21: IMPRESSION: 2.9 cm right mid thyroid TR 3 nodule meets criteria for biopsy as above. This correlates with the chest CT finding.   CTA Chest/CT abd & pelvis 02/06/21: IMPRESSION: 1. No pulmonary embolus. 2. No acute intrapulmonary abnormality. 3. Right striated nephrogram suggestive of pyelonephritis. No definite abscess formation. Correlate with urinalysis. 4. A 1.9 cm hypodense right thyroid gland nodule. Recommend thyroid US (ref: J Am Coll Radiol. 2015 Feb;12(2): 143-50). 5. Colonic diverticulosis with no acute diverticulitis. 6. Hepatomegaly.    EKG: 07/17/21 ([facility]): Normal sinus rhythm Possible left atrial enlargement Nonspecific ST abnormality   CV: Cardiac Event Monitor 02/14/20-03/14/20: Patient had  a minimum heart rate of 50 bpm, maximum heart rate of 147 bpm, and average heart rate of 78 bpm. Predominant underlying rhythm was sinus rhythm. Isolated PACs were rare (<1.0%). Isolated PVCs were rare (<1.0%). No evidence of atrial fibrillation. Triggered  and diary events associated with sinus rhythm and sinus tachycardia.   No malignant arrhythmias.    Past Medical History:  Diagnosis Date   Arthritis    Cancer ([place])    right leg melanoma   Dysrhythmia    Fibromyalgia    GERD (gastroesophageal reflux disease)    Hypertension    Irritable bowel disease    SVT (supraventricular tachycardia) (HCC)    Thyroid nodule    04/02/21 right FNA: benign follicular nodule (Bethesda Category II)    Past Surgical History:  Procedure Laterality Date   ABDOMINAL HYSTERECTOMY     HIP ARTHROSCOPY  2010   clean out arthiritis   KNEE ARTHROPLASTY      MEDICATIONS:  acetaminophen (TYLENOL) 650 MG CR tablet   alendronate (FOSAMAX) 70 MG tablet   amoxicillin (AMOXIL) 500 MG capsule   Ascorbic Acid (VITAMIN C) 1000 MG tablet   Calcium Carb-Cholecalciferol (CALCIUM 600/VITAMIN D PO)   celecoxib (CELEBREX) 200 MG capsule   cholecalciferol (VITAMIN D) 25 MCG (1000 UNIT) tablet   cyanocobalamin (,VITAMIN B-12,) 1000 MCG/ML injection   diltiazem (CARDIZEM) 30 MG tablet   Docusate Sodium 100 MG capsule   gabapentin (NEURONTIN) 300 MG capsule   lisinopril (ZESTRIL) 5 MG tablet   Magnesium 400 MG TABS   Melatonin 10 MG TABS   Multiple Vitamin (MULTIVITAMIN WITH MINERALS) TABS tablet   Nutritional Supplements (ESTROVEN PO)   pantoprazole (PROTONIX) 40 MG tablet   traMADol (ULTRAM) 50 MG tablet   triamcinolone cream (KENALOG) 0.1 %   vitamin B-12 (CYANOCOBALAMIN) 1000 MCG tablet   Zinc 50 MG TABS   No current facility-administered medications for this encounter.    Myra Gianotti, PA-C Surgical Short Stay/Anesthesiology [facility] Phone [phone] [facility] Phone [phone] 07/29/2021 10:47 AM

## 2021-07-30 ENCOUNTER — Other Ambulatory Visit: Payer: Self-pay

## 2021-07-30 ENCOUNTER — Ambulatory Visit (HOSPITAL_COMMUNITY): Payer: Medicare Other | Admitting: Vascular Surgery

## 2021-07-30 ENCOUNTER — Encounter (HOSPITAL_COMMUNITY): Payer: Self-pay | Admitting: Orthopaedic Surgery

## 2021-07-30 ENCOUNTER — Observation Stay (HOSPITAL_COMMUNITY)
Admission: RE | Admit: 2021-07-30 | Discharge: 2021-08-02 | Disposition: A | Discharge status: Home Health | Payer: Medicare Other | Attending: Orthopaedic Surgery | Admitting: Orthopaedic Surgery

## 2021-07-30 ENCOUNTER — Ambulatory Visit (HOSPITAL_COMMUNITY): Payer: Medicare Other

## 2021-07-30 ENCOUNTER — Ambulatory Visit (HOSPITAL_BASED_OUTPATIENT_CLINIC_OR_DEPARTMENT_OTHER): Payer: Medicare Other | Admitting: Anesthesiology

## 2021-07-30 ENCOUNTER — Encounter (HOSPITAL_COMMUNITY)
Admission: RE | Disposition: A | Discharge status: Home Health | Payer: Self-pay | Source: Home / Self Care | Attending: Orthopaedic Surgery

## 2021-07-30 DIAGNOSIS — M48061 Spinal stenosis, lumbar region without neurogenic claudication: Secondary | ICD-10-CM | POA: Diagnosis not present

## 2021-07-30 DIAGNOSIS — Z87891 Personal history of nicotine dependence: Secondary | ICD-10-CM | POA: Insufficient documentation

## 2021-07-30 DIAGNOSIS — Z85828 Personal history of other malignant neoplasm of skin: Secondary | ICD-10-CM | POA: Diagnosis not present

## 2021-07-30 DIAGNOSIS — Z96659 Presence of unspecified artificial knee joint: Secondary | ICD-10-CM | POA: Diagnosis not present

## 2021-07-30 DIAGNOSIS — M7138 Other bursal cyst, other site: Secondary | ICD-10-CM | POA: Diagnosis not present

## 2021-07-30 DIAGNOSIS — Z79899 Other long term (current) drug therapy: Secondary | ICD-10-CM | POA: Diagnosis not present

## 2021-07-30 DIAGNOSIS — I10 Essential (primary) hypertension: Secondary | ICD-10-CM | POA: Diagnosis not present

## 2021-07-30 DIAGNOSIS — M48062 Spinal stenosis, lumbar region with neurogenic claudication: Secondary | ICD-10-CM | POA: Diagnosis not present

## 2021-07-30 HISTORY — PX: LUMBAR LAMINECTOMY/DECOMPRESSION MICRODISCECTOMY: SHX5026

## 2021-07-30 SURGERY — LUMBAR LAMINECTOMY/DECOMPRESSION MICRODISCECTOMY
Anesthesia: General

## 2021-07-30 MED ORDER — METHOCARBAMOL 500 MG PO TABS: 500.0000 mg | ORAL_TABLET | Freq: Four times a day (QID) | ORAL | 0 refills | Status: DC | PRN

## 2021-07-30 MED ORDER — THROMBIN 5000 UNITS EX SOLR
CUTANEOUS | Status: AC
  Filled 2021-07-30: qty 5000

## 2021-07-30 MED ORDER — ONDANSETRON HCL 4 MG PO TABS
4.0000 mg | ORAL_TABLET | Freq: Four times a day (QID) | ORAL | Status: DC | PRN
  Administered 2021-07-30: 4 mg via ORAL
  Filled 2021-07-30: qty 1

## 2021-07-30 MED ORDER — HYDROMORPHONE HCL 1 MG/ML IJ SOLN
0.5000 mg | INTRAMUSCULAR | Status: AC | PRN
  Administered 2021-07-30 (×4): 0.5 mg via INTRAVENOUS

## 2021-07-30 MED ORDER — OXYCODONE HCL 5 MG PO TABS
5.0000 mg | ORAL_TABLET | ORAL | Status: DC | PRN
  Filled 2021-07-30: qty 1

## 2021-07-30 MED ORDER — POLYETHYLENE GLYCOL 3350 17 G PO PACK
17.0000 g | PACK | Freq: Every day | ORAL | Status: DC | PRN
  Administered 2021-07-31: 17 g via ORAL
  Administered 2021-08-01: 8.5 g via ORAL
  Filled 2021-07-30 (×2): qty 1

## 2021-07-30 MED ORDER — MELATONIN 5 MG PO TABS
10.0000 mg | ORAL_TABLET | Freq: Every day | ORAL | Status: DC
Start: 2021-07-30 — End: 2021-08-02
  Administered 2021-07-31: 10 mg via ORAL
  Filled 2021-07-30 (×2): qty 2

## 2021-07-30 MED ORDER — MENTHOL 3 MG MT LOZG
1.0000 | LOZENGE | OROMUCOSAL | Status: DC | PRN
Start: 2021-07-30 — End: 2021-08-02

## 2021-07-30 MED ORDER — PHENOL 1.4 % MT LIQD
1.0000 | OROMUCOSAL | Status: DC | PRN
Start: 2021-07-30 — End: 2021-08-02

## 2021-07-30 MED ORDER — 0.9 % SODIUM CHLORIDE (POUR BTL) OPTIME
TOPICAL | Status: DC | PRN
  Administered 2021-07-30: 1000 mL

## 2021-07-30 MED ORDER — HYDROMORPHONE HCL 1 MG/ML IJ SOLN
INTRAMUSCULAR | Status: AC
  Filled 2021-07-30: qty 2

## 2021-07-30 MED ORDER — ORAL CARE MOUTH RINSE: 15.0000 mL | Freq: Once | OROMUCOSAL | Status: AC

## 2021-07-30 MED ORDER — KETOROLAC TROMETHAMINE 30 MG/ML IJ SOLN
INTRAMUSCULAR | Status: AC
  Filled 2021-07-30: qty 1

## 2021-07-30 MED ORDER — DEXAMETHASONE SODIUM PHOSPHATE 10 MG/ML IJ SOLN
INTRAMUSCULAR | Status: AC
  Filled 2021-07-30: qty 1

## 2021-07-30 MED ORDER — ACETAMINOPHEN 325 MG PO TABS
650.0000 mg | ORAL_TABLET | ORAL | Status: DC | PRN
  Administered 2021-07-31 – 2021-08-02 (×7): 650 mg via ORAL
  Filled 2021-07-30 (×7): qty 2

## 2021-07-30 MED ORDER — PHENYLEPHRINE 80 MCG/ML (10ML) SYRINGE FOR IV PUSH (FOR BLOOD PRESSURE SUPPORT)
PREFILLED_SYRINGE | INTRAVENOUS | Status: DC | PRN
  Administered 2021-07-30: 80 ug via INTRAVENOUS

## 2021-07-30 MED ORDER — ONDANSETRON HCL 4 MG/2ML IJ SOLN
INTRAMUSCULAR | Status: AC
Start: 2021-07-30 — End: ?
  Filled 2021-07-30: qty 2

## 2021-07-30 MED ORDER — FENTANYL CITRATE (PF) 100 MCG/2ML IJ SOLN
INTRAMUSCULAR | Status: AC
  Filled 2021-07-30: qty 2

## 2021-07-30 MED ORDER — ONDANSETRON HCL 4 MG/2ML IJ SOLN: 4.0000 mg | Freq: Four times a day (QID) | INTRAMUSCULAR | Status: DC | PRN

## 2021-07-30 MED ORDER — HYDROMORPHONE HCL 1 MG/ML IJ SOLN
INTRAMUSCULAR | Status: AC
  Filled 2021-07-30: qty 1

## 2021-07-30 MED ORDER — OXYCODONE HCL 5 MG PO TABS
5.0000 mg | ORAL_TABLET | ORAL | Status: AC | PRN
  Administered 2021-07-30 – 2021-08-01 (×12): 10 mg via ORAL
  Filled 2021-07-30 (×3): qty 2
  Filled 2021-07-30: qty 1
  Filled 2021-07-30 (×8): qty 2

## 2021-07-30 MED ORDER — AMISULPRIDE (ANTIEMETIC) 5 MG/2ML IV SOLN: 10.0000 mg | Freq: Once | INTRAVENOUS | Status: DC | PRN

## 2021-07-30 MED ORDER — LISINOPRIL 5 MG PO TABS
5.0000 mg | ORAL_TABLET | Freq: Every day | ORAL | Status: DC
Start: 2021-07-30 — End: 2021-08-02
  Filled 2021-07-30 (×3): qty 1

## 2021-07-30 MED ORDER — PROPOFOL 10 MG/ML IV BOLUS
INTRAVENOUS | Status: AC
  Filled 2021-07-30: qty 20

## 2021-07-30 MED ORDER — MIDAZOLAM HCL 2 MG/2ML IJ SOLN
INTRAMUSCULAR | Status: AC
Start: 2021-07-30 — End: ?
  Filled 2021-07-30: qty 2

## 2021-07-30 MED ORDER — FENTANYL CITRATE (PF) 100 MCG/2ML IJ SOLN
25.0000 ug | INTRAMUSCULAR | Status: DC | PRN
  Administered 2021-07-30 (×3): 50 ug via INTRAVENOUS

## 2021-07-30 MED ORDER — HEMOSTATIC AGENTS (NO CHARGE) OPTIME
TOPICAL | Status: DC | PRN
  Administered 2021-07-30: 1 via TOPICAL

## 2021-07-30 MED ORDER — ACETAMINOPHEN 10 MG/ML IV SOLN
INTRAVENOUS | Status: AC
Start: 2021-07-30 — End: ?
  Filled 2021-07-30: qty 100

## 2021-07-30 MED ORDER — FENTANYL CITRATE (PF) 250 MCG/5ML IJ SOLN
INTRAMUSCULAR | Status: AC
  Filled 2021-07-30: qty 5

## 2021-07-30 MED ORDER — KETOROLAC TROMETHAMINE 30 MG/ML IJ SOLN
30.0000 mg | Freq: Once | INTRAMUSCULAR | Status: AC
  Administered 2021-07-30: 30 mg via INTRAVENOUS
  Filled 2021-07-30: qty 1

## 2021-07-30 MED ORDER — METHOCARBAMOL 500 MG PO TABS
500.0000 mg | ORAL_TABLET | Freq: Four times a day (QID) | ORAL | Status: DC | PRN
  Administered 2021-07-31 – 2021-08-01 (×3): 500 mg via ORAL
  Filled 2021-07-30 (×4): qty 1

## 2021-07-30 MED ORDER — THROMBIN 5000 UNITS EX SOLR
OROMUCOSAL | Status: DC | PRN
  Administered 2021-07-30: 5 mL via TOPICAL

## 2021-07-30 MED ORDER — OXYCODONE HCL 5 MG PO TABS
10.0000 mg | ORAL_TABLET | Freq: Once | ORAL | Status: AC
  Administered 2021-07-30: 10 mg via ORAL

## 2021-07-30 MED ORDER — ONDANSETRON HCL 4 MG/2ML IJ SOLN: 4.0000 mg | Freq: Once | INTRAMUSCULAR | Status: DC | PRN

## 2021-07-30 MED ORDER — ROCURONIUM BROMIDE 10 MG/ML (PF) SYRINGE
PREFILLED_SYRINGE | INTRAVENOUS | Status: DC | PRN
  Administered 2021-07-30: 10 mg via INTRAVENOUS
  Administered 2021-07-30: 20 mg via INTRAVENOUS
  Administered 2021-07-30: 10 mg via INTRAVENOUS
  Administered 2021-07-30: 50 mg via INTRAVENOUS

## 2021-07-30 MED ORDER — CHLORHEXIDINE GLUCONATE 0.12 % MT SOLN
15.0000 mL | Freq: Once | OROMUCOSAL | Status: AC
  Administered 2021-07-30: 15 mL via OROMUCOSAL
  Filled 2021-07-30: qty 15

## 2021-07-30 MED ORDER — LACTATED RINGERS IV SOLN: INTRAVENOUS | Status: DC

## 2021-07-30 MED ORDER — ONDANSETRON HCL 4 MG/2ML IJ SOLN
INTRAMUSCULAR | Status: DC | PRN
  Administered 2021-07-30: 4 mg via INTRAVENOUS

## 2021-07-30 MED ORDER — ACETAMINOPHEN 500 MG PO TABS
1000.0000 mg | ORAL_TABLET | Freq: Once | ORAL | Status: AC
Start: 2021-07-30 — End: 2021-07-30
  Administered 2021-07-30: 1000 mg via ORAL
  Filled 2021-07-30 (×2): qty 2

## 2021-07-30 MED ORDER — SODIUM CHLORIDE 0.9% FLUSH: 3.0000 mL | INTRAVENOUS | Status: DC | PRN

## 2021-07-30 MED ORDER — LIDOCAINE 2% (20 MG/ML) 5 ML SYRINGE
INTRAMUSCULAR | Status: DC | PRN
  Administered 2021-07-30: 70 mg via INTRAVENOUS

## 2021-07-30 MED ORDER — METHOCARBAMOL 1000 MG/10ML IJ SOLN: 500.0000 mg | Freq: Four times a day (QID) | INTRAVENOUS | Status: DC | PRN

## 2021-07-30 MED ORDER — ACETAMINOPHEN 650 MG RE SUPP: 650.0000 mg | RECTAL | Status: DC | PRN

## 2021-07-30 MED ORDER — BUPIVACAINE HCL (PF) 0.25 % IJ SOLN
INTRAMUSCULAR | Status: AC
Start: 2021-07-30 — End: ?
  Filled 2021-07-30: qty 30

## 2021-07-30 MED ORDER — SUGAMMADEX SODIUM 200 MG/2ML IV SOLN
INTRAVENOUS | Status: DC | PRN
  Administered 2021-07-30: 200 mg via INTRAVENOUS

## 2021-07-30 MED ORDER — HYDROMORPHONE HCL 1 MG/ML IJ SOLN
0.5000 mg | INTRAMUSCULAR | Status: DC | PRN
  Administered 2021-07-31: 0.5 mg via INTRAVENOUS
  Filled 2021-07-30: qty 0.5

## 2021-07-30 MED ORDER — SODIUM CHLORIDE 0.9% FLUSH
3.0000 mL | Freq: Two times a day (BID) | INTRAVENOUS | Status: DC
  Administered 2021-07-31 (×2): 3 mL via INTRAVENOUS

## 2021-07-30 MED ORDER — DOCUSATE SODIUM 100 MG PO CAPS
100.0000 mg | ORAL_CAPSULE | Freq: Two times a day (BID) | ORAL | Status: DC
  Administered 2021-07-30 – 2021-08-02 (×6): 100 mg via ORAL
  Filled 2021-07-30 (×6): qty 1

## 2021-07-30 MED ORDER — MIDAZOLAM HCL 2 MG/2ML IJ SOLN
INTRAMUSCULAR | Status: DC | PRN
  Administered 2021-07-30: 2 mg via INTRAVENOUS

## 2021-07-30 MED ORDER — SODIUM CHLORIDE 0.9 % IV SOLN: 250.0000 mL | INTRAVENOUS | Status: DC

## 2021-07-30 MED ORDER — DILTIAZEM HCL 30 MG PO TABS: 30.0000 mg | ORAL_TABLET | ORAL | Status: DC | PRN

## 2021-07-30 MED ORDER — SODIUM CHLORIDE 0.9 % IV SOLN: INTRAVENOUS | Status: DC

## 2021-07-30 MED ORDER — KETAMINE HCL 50 MG/5ML IJ SOSY
PREFILLED_SYRINGE | INTRAMUSCULAR | Status: AC
Start: 2021-07-30 — End: ?
  Filled 2021-07-30: qty 5

## 2021-07-30 MED ORDER — CEFAZOLIN SODIUM-DEXTROSE 2-4 GM/100ML-% IV SOLN
2.0000 g | INTRAVENOUS | Status: AC
  Administered 2021-07-30: 2 g via INTRAVENOUS
  Filled 2021-07-30: qty 100

## 2021-07-30 MED ORDER — FENTANYL CITRATE (PF) 250 MCG/5ML IJ SOLN
INTRAMUSCULAR | Status: DC | PRN
  Administered 2021-07-30 (×4): 25 ug via INTRAVENOUS
  Administered 2021-07-30: 100 ug via INTRAVENOUS
  Administered 2021-07-30: 25 ug via INTRAVENOUS

## 2021-07-30 MED ORDER — OXYCODONE HCL 5 MG PO TABS: 5.0000 mg | ORAL_TABLET | Freq: Once | ORAL | Status: DC | PRN

## 2021-07-30 MED ORDER — PANTOPRAZOLE SODIUM 40 MG PO TBEC
40.0000 mg | DELAYED_RELEASE_TABLET | Freq: Every day | ORAL | Status: DC
Start: 2021-07-31 — End: 2021-08-02
  Administered 2021-07-31 – 2021-08-02 (×3): 40 mg via ORAL
  Filled 2021-07-30 (×3): qty 1

## 2021-07-30 MED ORDER — DEXAMETHASONE SODIUM PHOSPHATE 10 MG/ML IJ SOLN
INTRAMUSCULAR | Status: DC | PRN
  Administered 2021-07-30: 5 mg via INTRAVENOUS

## 2021-07-30 MED ORDER — OXYCODONE HCL 5 MG PO TABS
ORAL_TABLET | ORAL | Status: AC
  Filled 2021-07-30: qty 2

## 2021-07-30 MED ORDER — OXYCODONE-ACETAMINOPHEN 5-325 MG PO TABS: 1.0000 | ORAL_TABLET | ORAL | 0 refills | Status: DC | PRN

## 2021-07-30 MED ORDER — KETAMINE HCL 10 MG/ML IJ SOLN
INTRAMUSCULAR | Status: DC | PRN
  Administered 2021-07-30: 25 mg via INTRAVENOUS

## 2021-07-30 MED ORDER — OXYCODONE HCL 5 MG/5ML PO SOLN: 5.0000 mg | Freq: Once | ORAL | Status: DC | PRN

## 2021-07-30 MED ORDER — MAGNESIUM OXIDE -MG SUPPLEMENT 400 (240 MG) MG PO TABS
400.0000 mg | ORAL_TABLET | Freq: Two times a day (BID) | ORAL | Status: DC
  Administered 2021-07-30 – 2021-08-02 (×6): 400 mg via ORAL
  Filled 2021-07-30 (×6): qty 1

## 2021-07-30 MED ORDER — GABAPENTIN 300 MG PO CAPS
300.0000 mg | ORAL_CAPSULE | Freq: Three times a day (TID) | ORAL | Status: DC
  Administered 2021-07-30 – 2021-08-02 (×8): 300 mg via ORAL
  Filled 2021-07-30 (×8): qty 1

## 2021-07-30 MED ORDER — BUPIVACAINE HCL (PF) 0.25 % IJ SOLN
INTRAMUSCULAR | Status: DC | PRN
  Administered 2021-07-30: 10 mL

## 2021-07-30 MED ORDER — PROPOFOL 10 MG/ML IV BOLUS
INTRAVENOUS | Status: DC | PRN
  Administered 2021-07-30: 30 mg via INTRAVENOUS
  Administered 2021-07-30: 100 mg via INTRAVENOUS

## 2021-07-30 SURGICAL SUPPLY — 51 items
BAG COUNTER SPONGE SURGICOUNT (BAG) ×3 IMPLANT
BUR ROUND FLUTED 4 SOFT TCH (BURR) ×1 IMPLANT
CANISTER SUCT 3000ML PPV (MISCELLANEOUS) ×2 IMPLANT
CLSR STERI-STRIP ANTIMIC 1/2X4 (GAUZE/BANDAGES/DRESSINGS) ×2 IMPLANT
COVER SURGICAL LIGHT HANDLE (MISCELLANEOUS) ×1 IMPLANT
DERMABOND ADHESIVE PROPEN (GAUZE/BANDAGES/DRESSINGS) ×2
DERMABOND ADVANCED .7 DNX6 (GAUZE/BANDAGES/DRESSINGS) IMPLANT
DRAPE HALF SHEET 40X57 (DRAPES) ×4 IMPLANT
DRAPE MICROSCOPE LEICA (MISCELLANEOUS) ×2 IMPLANT
DRAPE SURG 17X23 STRL (DRAPES) ×2 IMPLANT
DRSG MEPILEX BORDER 4X4 (GAUZE/BANDAGES/DRESSINGS) ×2 IMPLANT
DRSG MEPILEX BORDER 4X8 (GAUZE/BANDAGES/DRESSINGS) ×1 IMPLANT
DURAPREP 26ML APPLICATOR (WOUND CARE) ×2 IMPLANT
DURASEAL APPLICATOR TIP (TIP) ×1 IMPLANT
DURASEAL SPINE SEALANT 5 POLY (MISCELLANEOUS) ×1 IMPLANT
ELECT REM PT RETURN 9FT ADLT (ELECTROSURGICAL) ×2
ELECTRODE REM PT RTRN 9FT ADLT (ELECTROSURGICAL) ×1 IMPLANT
GLOVE BIOGEL PI IND STRL 8 (GLOVE) ×2 IMPLANT
GLOVE BIOGEL PI INDICATOR 8 (GLOVE) ×4
GLOVE ORTHO TXT STRL SZ7.5 (GLOVE) ×4 IMPLANT
GOWN STRL REUS W/ TWL LRG LVL3 (GOWN DISPOSABLE) ×2 IMPLANT
GOWN STRL REUS W/ TWL XL LVL3 (GOWN DISPOSABLE) ×1 IMPLANT
GOWN STRL REUS W/TWL 2XL LVL3 (GOWN DISPOSABLE) ×2 IMPLANT
GOWN STRL REUS W/TWL LRG LVL3 (GOWN DISPOSABLE) ×4
GOWN STRL REUS W/TWL XL LVL3 (GOWN DISPOSABLE) ×2
HEMOSTAT POWDER KIT SURGIFOAM (HEMOSTASIS) ×1 IMPLANT
KIT BASIN OR (CUSTOM PROCEDURE TRAY) ×2 IMPLANT
KIT TURNOVER KIT B (KITS) ×2 IMPLANT
MANIFOLD NEPTUNE II (INSTRUMENTS) ×1 IMPLANT
NDL HYPO 25GX1X1/2 BEV (NEEDLE) ×1 IMPLANT
NDL SPNL 18GX3.5 QUINCKE PK (NEEDLE) ×1 IMPLANT
NEEDLE HYPO 25GX1X1/2 BEV (NEEDLE) ×2 IMPLANT
NEEDLE SPNL 18GX3.5 QUINCKE PK (NEEDLE) ×2 IMPLANT
NS IRRIG 1000ML POUR BTL (IV SOLUTION) ×2 IMPLANT
PACK LAMINECTOMY ORTHO (CUSTOM PROCEDURE TRAY) ×2 IMPLANT
PAD ARMBOARD 7.5X6 YLW CONV (MISCELLANEOUS) ×5 IMPLANT
PATTIES SURGICAL .5 X.5 (GAUZE/BANDAGES/DRESSINGS) ×1 IMPLANT
PATTIES SURGICAL .75X.75 (GAUZE/BANDAGES/DRESSINGS) IMPLANT
PATTIES SURGICAL 1X1 (DISPOSABLE) ×1 IMPLANT
SPIKE FLUID TRANSFER (MISCELLANEOUS) ×1 IMPLANT
SPONGE T-LAP 4X18 ~~LOC~~+RFID (SPONGE) ×1 IMPLANT
SUT NURALON 4 0 TR CR/8 (SUTURE) ×1 IMPLANT
SUT VIC AB 0 CT1 27 (SUTURE) ×2
SUT VIC AB 0 CT1 27XBRD ANBCTR (SUTURE) IMPLANT
SUT VIC AB 1 CTX 36 (SUTURE) ×4
SUT VIC AB 1 CTX36XBRD ANBCTR (SUTURE) ×1 IMPLANT
SUT VIC AB 2-0 CT1 27 (SUTURE) ×2
SUT VIC AB 2-0 CT1 TAPERPNT 27 (SUTURE) ×1 IMPLANT
SUT VIC AB 3-0 X1 27 (SUTURE) ×2 IMPLANT
TOWEL GREEN STERILE (TOWEL DISPOSABLE) ×2 IMPLANT
TOWEL GREEN STERILE FF (TOWEL DISPOSABLE) ×2 IMPLANT

## 2021-07-30 NOTE — Discharge Instructions (Signed)
Ok to shower 5 days postop.  Do not apply any creams or ointments to incision.  Do not remove dressing.    No aggressive activity.  No bending, squatting or prolonged sitting.  Mostly be in reclined position or lying down.     No driving.   If you have increased pain, wound drainage or any questions contact our office immediately.

## 2021-07-30 NOTE — Plan of Care (Signed)

## 2021-07-30 NOTE — Progress Notes (Signed)
Patient ID: Amber Maldonado, female   DOB: [birth year], 61 y.o.   MRN: [id number] Head of bed down patient can roll from her back to either side until a.m.  If no problems with a headache once I make rounds and see her in the morning then we will consider getting her up and mobilizing her normal postop fashion.

## 2021-07-30 NOTE — H&P (Signed)
Amber Maldonado is an 61 y.o. female.   Chief Complaint: Low back pain and lower extremity radiculopathy HPI: 61 year old white female history of L3-4 stenosis comes in for preop evaluation.  States that symptoms unchanged from previous visit.  She is wanting to proceed with L3-4 decompression and removal of intraspinal extradural facet cyst and scheduled.  Today history and physical performed.  Review of systems negative.  We received preop cardiac clearance,    Past Medical History:  Diagnosis Date   Arthritis    Cancer ([place])    right leg melanoma   Dysrhythmia    Fibromyalgia    GERD (gastroesophageal reflux disease)    Hypertension    Irritable bowel disease    SVT (supraventricular tachycardia) (HCC)    Thyroid nodule    04/02/21 right FNA: benign follicular nodule (Bethesda Category II)    Past Surgical History:  Procedure Laterality Date   ABDOMINAL HYSTERECTOMY     HIP ARTHROSCOPY  2010   clean out arthiritis   KNEE ARTHROPLASTY      History reviewed. No pertinent family history. Social History:  reports that she quit smoking about 29 years ago. Her smoking use included cigarettes. She has never used smokeless tobacco. She reports current alcohol use. She reports that she does not use drugs.  Allergies:  Allergies  Allergen Reactions   Triamcinolone Other (See Comments)    Burning and redness starting at face and traveling down body INJECTION- no shortness of breath    Medications Prior to Admission  Medication Sig Dispense Refill   acetaminophen (TYLENOL) 650 MG CR tablet Take 1,300 mg by mouth in the morning and at bedtime.     alendronate (FOSAMAX) 70 MG tablet Take 70 mg by mouth every Friday. Take with a full glass of water on an empty stomach.     Ascorbic Acid (VITAMIN C) 1000 MG tablet Take 1,000 mg by mouth every evening.     Calcium Carb-Cholecalciferol (CALCIUM 600/VITAMIN D PO) Take 1 tablet by mouth 2 (two) times daily.     celecoxib (CELEBREX) 200 MG  capsule Take 200 mg by mouth daily.     cholecalciferol (VITAMIN D) 25 MCG (1000 UNIT) tablet Take 3,000 Units by mouth daily.     cyanocobalamin (,VITAMIN B-12,) 1000 MCG/ML injection Inject 1,000 mcg into the muscle every 30 (thirty) days.     diltiazem (CARDIZEM) 30 MG tablet Take 1 tablet (30 mg total) by mouth as needed (palpitations). 30 tablet 2   Docusate Sodium 100 MG capsule Take 100 mg by mouth 2 (two) times daily.     gabapentin (NEURONTIN) 300 MG capsule Take 300 mg by mouth 3 (three) times daily.     lisinopril (ZESTRIL) 5 MG tablet Take 5 mg by mouth daily.     Magnesium 400 MG TABS Take 400 mg by mouth in the morning and at bedtime.     Melatonin 10 MG TABS Take 10 mg by mouth at bedtime.     Multiple Vitamin (MULTIVITAMIN WITH MINERALS) TABS tablet Take 2 tablets by mouth daily.     Nutritional Supplements (ESTROVEN PO) Take 1 tablet by mouth daily.     pantoprazole (PROTONIX) 40 MG tablet Take 40 mg by mouth daily before breakfast.     traMADol (ULTRAM) 50 MG tablet Take 50 mg by mouth every 6 (six) hours as needed (for pain).     vitamin B-12 (CYANOCOBALAMIN) 1000 MCG tablet Take 1,000 mcg by mouth daily.     Zinc  50 MG TABS Take 50 mg by mouth daily.     amoxicillin (AMOXIL) 500 MG capsule Take 2,000 mg by mouth See admin instructions. Take 2,000 mg by mouth two hours before dental appointments     triamcinolone cream (KENALOG) 0.1 % Apply 1 Application topically 2 (two) times daily as needed (rash).      No results found for this or any previous visit (from the past 48 hour(s)). No results found.  Review of Systems  Constitutional:  Positive for activity change.  HENT: Negative.    Respiratory: Negative.    Cardiovascular: Negative.   Gastrointestinal: Negative.   Musculoskeletal:  Positive for back pain and gait problem.  Skin: Negative.   Neurological:  Positive for numbness.  Psychiatric/Behavioral: Negative.      Blood pressure 138/90, pulse 67, temperature  98.4 F (36.9 C), temperature source Oral, resp. rate 18, height 5' (1.524 m), weight 72.6 kg, SpO2 100 %. Physical Exam HENT:     Head: Normocephalic and atraumatic.     Nose: Nose normal.  Eyes:     Extraocular Movements: Extraocular movements intact.  Cardiovascular:     Rate and Rhythm: Normal rate.  Pulmonary:     Effort: Pulmonary effort is normal. No respiratory distress.  Abdominal:     General: There is no distension.     Tenderness: There is no abdominal tenderness.  Neurological:     Mental Status: She is alert and oriented to person, place, and time.  Psychiatric:        Mood and Affect: Mood normal.      Assessment/Plan L3-4 stenosis and facet cyst  We will proceed with L3-4 decompression and removal of intraspinal extradural facet cyst as scheduled.  Surgical procedure discussed.  All questions answered.  Benjiman Core, PA-C 07/30/2021, 11:08 AM

## 2021-07-30 NOTE — Op Note (Signed)
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  Operative microscope used.  Surgeon: Rodell Perna MD  Assistant: Benjiman Core, PA-C medically necessary and present for the entire procedure  Anesthesia General orotracheal plus Marcaine skin local  EBL: 300 mL.  Complication: Intra-Op dural repair of 2 mm dural tear repaired with 3 simple Nurolon sutures.  Procedure: After induction general anesthesia orotracheal intubation Ancef prophylaxis patient was placed on chest rolls supine arms at 99 careful pads anterior to the shoulders underneath the ulnar nerve calf pump versus.  Back was prepped with DuraPrep there is squared with towels Betadine Steri-Drape after sterile skin marker for #and laminectomy sheet and draped.  Timeout procedure was completed.  Needle localization crosstable lateral x-ray needle was adjusted for the planned area of decompression.  Patient had a very large cyst greater than a centimeter taking up most of the canal with severe neurogenic claudication symptoms and radicular symptoms on the right side.  Facet cyst arose from the L3-4 facet.  Midline incision was made subperiosteal dissection on the lamina self-retaining tractor placed 2 Coker clamps were placed slightly adjusted after initial x-ray and then bone was marked with a blue marker for area of decompression above and below.  Laminectomy was performed at L3 taking the top portion of L4 and primarily exposure of the dura on the left side away from the cyst and then removal of lateral wall with osteotome and Kerrison.  Operative microscope was used for visualization and decompression.  There was severe compression of the dural sac with a large cyst which was adherent to the dura.  Greater than an hour was used using the dural separator, Woodson, pickups, scalpel and cyst was carefully peeled off the dura.  At the  inferior aspect a small tear occurred on the dorsal right side as the last remaining portion of the cyst was being peeled and the small tear had a small bubble with no CSF leak.  Some Nurolon was obtained as well as the micro repair kit patty was placed over the dura and we finished doing the decompression and removal of some brain remaining ligaments right and left for decompression of the dural tube back to its normal round shape.  Additional top portion of L4 was taken so that we could see the complete extent of the tear and then 3 simple sutures of 4-0 Nurolon were placed side to side which closed the repair.  Laterally on the right side midway down to the disc directly at the top portion of the disc there was one remaining small tear which did not have a bubble and a simple suture was placed.  Patient was Valsalva to 30 mm of pressure by the CRNA there was no CSF leak.  We continue to check both lateral gutters removing any remaining ligaments make sure there were no sharp spurs present.  Facet was intact patient did not have instability.  Using the operative microscope with visualization of the disc and right and left side there was no disc herniation or protrusion causing pressure.  Some DuraSeal was mixed and then dripped onto the area again Valsalva was checked there was no CSF leak.  Little bit of the epidural bleeding and normal for 1 level decompression.  Fascia is closed interrupted #1 Vicryl 2-0 Vicryl subcutaneous tissue subcuticular skin closure Dermabond Steri-Strips and then postop dressing and transferred to care room.  Patient tolerated the  procedure well and was neurologically intact in the PACU.

## 2021-07-30 NOTE — Interval H&P Note (Signed)
History and Physical Interval Note:  07/30/2021 11:54 AM  Amber Maldonado  has presented today for surgery, with the diagnosis of L3-4 stenosis, facet cyst.  The various methods of treatment have been discussed with the patient and family. After consideration of risks, benefits and other options for treatment, the patient has consented to  Procedure(s): L3-4 DECOMPRESSION, REMOVAL OF INTRASPINAL EXTRADURAL FACET CYST (N/A) as a surgical intervention.  The patient's history has been reviewed, patient examined, no change in status, stable for surgery.  I have reviewed the patient's chart and labs.  Questions were answered to the patient's satisfaction.     [name]

## 2021-07-30 NOTE — Transfer of Care (Signed)
Immediate Anesthesia Transfer of Care Note  Patient: Amber Maldonado  Procedure(s) Performed: LUMBAR THREE-FOUR DECOMPRESSION, REMOVAL OF INTRASPINAL EXTRADURAL FACET CYST  Patient Location: PACU  Anesthesia Type:General  Level of Consciousness: awake, drowsy and patient cooperative  Airway & Oxygen Therapy: Patient Spontanous Breathing and Patient connected to nasal cannula oxygen  Post-op Assessment: Report given to RN, Post -op Vital signs reviewed and stable and Patient moving all extremities  Post vital signs: Reviewed and stable  Last Vitals:  Vitals Value Taken Time  BP 129/91 07/30/21 1540  Temp    Pulse 100 07/30/21 1545  Resp 12 07/30/21 1545  SpO2 98 % 07/30/21 1545  Vitals shown include unvalidated device data.  Last Pain:  Vitals:   07/30/21 1023  TempSrc:   PainSc: 0-No pain         Complications: No notable events documented.

## 2021-07-30 NOTE — Anesthesia Procedure Notes (Signed)
Procedure Name: Intubation Date/Time: 07/30/2021 1:10 PM  Performed by: Leonor Liv, CRNAPre-anesthesia Checklist: Patient identified, Emergency Drugs available, Suction available and Patient being monitored Patient Re-evaluated:Patient Re-evaluated prior to induction Oxygen Delivery Method: Circle system utilized Preoxygenation: Pre-oxygenation with 100% oxygen Induction Type: IV induction Ventilation: Mask ventilation without difficulty Laryngoscope Size: Miller and 2 Grade View: Grade I Tube type: Oral Tube size: 7.0 mm Number of attempts: 1 Airway Equipment and Method: Stylet Placement Confirmation: ETT inserted through vocal cords under direct vision, positive ETCO2 and breath sounds checked- equal and bilateral Secured at: 21 cm Tube secured with: Tape Dental Injury: Teeth and Oropharynx as per pre-operative assessment  Comments: Lynnea Ferrier, SRNA

## 2021-07-30 NOTE — Progress Notes (Signed)
Verbal order from Dr Lorin Mercy via pts phone to increase oxy to 5-10 mgs.

## 2021-07-31 ENCOUNTER — Encounter (HOSPITAL_COMMUNITY): Payer: Self-pay | Admitting: Orthopaedic Surgery

## 2021-07-31 DIAGNOSIS — M48062 Spinal stenosis, lumbar region with neurogenic claudication: Secondary | ICD-10-CM | POA: Diagnosis not present

## 2021-07-31 NOTE — Progress Notes (Signed)
Patient ID: Amber Maldonado, female   DOB: Dec 11, 1960, 61 y.o.   MRN: [id number] Pain better walked some in room. Therapy in AM then likely home tomorrow.  Benjiman Core PA-C to see pt in AM

## 2021-07-31 NOTE — Evaluation (Signed)
Occupational Therapy Evaluation Patient Details Name: Amber Maldonado MRN: [id number] DOB: 12/22/1960 Today's Date: 07/31/2021   History of Present Illness Pt is a 61 y/o female s/p L3-4 decompression. PMH includes fibromyalgia and HTN.   Clinical Impression   PTA patient reports independent, driving and working. Admitted for above and presents with problem list below, including back pain and precautions, decreased activity tolerance and impaired balance.  Pt currently requires min guard for bed mobility, min assist for transfers and up to mod assist for Adls.  Believe she will progress well once pain is better controlled.  Educated on back precautions, ADL compensatory techniques, recommendations, safety and DME. Will follow acutely to optimize independence and safety.      Recommendations for follow up therapy are one component of a multi-disciplinary discharge planning process, led by the attending physician.  Recommendations may be updated based on patient status, additional functional criteria and insurance authorization.   Follow Up Recommendations  Home health OT    Assistance Recommended at Discharge Frequent or constant Supervision/Assistance  Patient can return home with the following A little help with walking and/or transfers;A lot of help with bathing/dressing/bathroom;Assistance with cooking/housework;Assist for transportation;Help with stairs or ramp for entrance    Functional Status Assessment  Patient has had a recent decline in their functional status and demonstrates the ability to make significant improvements in function in a reasonable and predictable amount of time.  Equipment Recommendations  BSC/3in1    Recommendations for Other Services       Precautions / Restrictions Precautions Precautions: Back Precaution Booklet Issued: Yes (comment) Precaution Comments: Reviewed back precautions with pt. Required Braces or Orthoses:  (no brace) Restrictions Weight  Bearing Restrictions: No      Mobility Bed Mobility Overal bed mobility: Needs Assistance Bed Mobility: Rolling, Sidelying to Sit, Sit to Sidelying Rolling: Supervision Sidelying to sit: Min guard     Sit to sidelying: Min guard General bed mobility comments: increased time and cueing for log roll technique    Transfers Overall transfer level: Needs assistance Equipment used: Rolling walker (2 wheels) Transfers: Sit to/from Stand Sit to Stand: Min assist           General transfer comment: min assist to power up and steady given increased time and cueing for posture      Balance Overall balance assessment: Needs assistance Sitting-balance support: No upper extremity supported, Feet supported Sitting balance-Leahy Scale: Fair     Standing balance support: Bilateral upper extremity supported, During functional activity Standing balance-Leahy Scale: Poor Standing balance comment: relies on RW                           ADL either performed or assessed with clinical judgement   ADL Overall ADL's : Needs assistance/impaired     Grooming: Min guard;Sitting           Upper Body Dressing : Minimal assistance;Sitting   Lower Body Dressing: Moderate assistance;Sit to/from stand Lower Body Dressing Details (indicate cue type and reason): requires assist for socks, min assist in standing but relies on BUE support Toilet Transfer: Minimal assistance;Rolling walker (2 wheels) Toilet Transfer Details (indicate cue type and reason): simulated side stepping at EOB         Functional mobility during ADLs: Minimal assistance;Min guard;Rolling walker (2 wheels);Cueing for safety;Cueing for sequencing General ADL Comments: pt limited by pain and decreased activity tolerance     Vision   Vision Assessment?: No apparent  visual deficits     Perception     Praxis      Pertinent Vitals/Pain Pain Assessment Pain Assessment: 0-10 Pain Score: 6  Pain Location:  back Pain Descriptors / Indicators: Grimacing, Guarding Pain Intervention(s): Limited activity within patient's tolerance, Monitored during session, Repositioned     Hand Dominance Right   Extremity/Trunk Assessment Upper Extremity Assessment Upper Extremity Assessment: Overall WFL for tasks assessed   Lower Extremity Assessment Lower Extremity Assessment: Defer to PT evaluation   Cervical / Trunk Assessment Cervical / Trunk Assessment: Back Surgery   Communication Communication Communication: No difficulties   Cognition Arousal/Alertness: Awake/alert Behavior During Therapy: WFL for tasks assessed/performed Overall Cognitive Status: Within Functional Limits for tasks assessed                                       General Comments  encouraged use of BSC with nursing, pt demonstrates ability to side step at [facility] and march in place but unable to tolerate further mobility due to pain    Exercises     Shoulder Instructions      Home Living Family/patient expects to be discharged to:: Private residence Living Arrangements: Alone Available Help at Discharge: Family;Available PRN/intermittently (daughter and son in law, oldest daughter lives 15 minutes away) Type of Home: Apartment Home Access: Level entry     Home Layout: One level     Bathroom Shower/Tub: Teacher, early years/pre: Handicapped height     Home Equipment: Conservation officer, nature (2 wheels);Cane - single point;Shower seat;Grab bars - toilet;Grab bars - tub/shower          Prior Functioning/Environment Prior Level of Function : Independent/Modified Independent;Working/employed;Driving               ADLs Comments: Works as a Designer, industrial/product Problem List: Decreased strength;Decreased activity tolerance;Impaired balance (sitting and/or standing);Pain;Decreased knowledge of precautions;Decreased knowledge of use of DME or AE      OT Treatment/Interventions: Self-care/ADL  training;Therapeutic exercise;DME and/or AE instruction;Therapeutic activities;Patient/family education;Balance training    OT Goals(Current goals can be found in the care plan section) Acute Rehab OT Goals Patient Stated Goal: less pain OT Goal Formulation: With patient Time For Goal Achievement: 08/14/21 Potential to Achieve Goals: Good  OT Frequency: Min 2X/week    Co-evaluation              AM-PAC OT "6 Clicks" Daily Activity     Outcome Measure Help from another person eating meals?: None Help from another person taking care of personal grooming?: A Little Help from another person toileting, which includes using toliet, bedpan, or urinal?: A Lot Help from another person bathing (including washing, rinsing, drying)?: A Lot Help from another person to put on and taking off regular upper body clothing?: A Little Help from another person to put on and taking off regular lower body clothing?: A Lot 6 Click Score: 16   End of Session Equipment Utilized During Treatment: Rolling walker (2 wheels) Nurse Communication: Mobility status;Precautions;Other (comment) (use of BSC using RW)  Activity Tolerance: Patient tolerated treatment well;Patient limited by pain Patient left: in bed;with call bell/phone within reach  OT Visit Diagnosis: Other abnormalities of gait and mobility (R26.89);Pain Pain - part of body:  (back)                Time: 9323-5573 OT Time Calculation (  min): 29 min Charges:  OT General Charges $OT Visit: 1 Visit OT Evaluation $OT Eval Low Complexity: 1 Low OT Treatments $Self Care/Home Management : 8-22 mins  Jolaine Artist, OT Acute Rehabilitation Services Office [phone]   Delight Stare 07/31/2021, 1:10 PM

## 2021-07-31 NOTE — Progress Notes (Signed)
Pt sleeping in room. Pt stated that she called out for pain meds three times. This RN only notified once while on break. Pain meds given.

## 2021-07-31 NOTE — Progress Notes (Signed)
Patient ID: Amber Maldonado, female   DOB: Oct 22, 1960, 61 y.o.   MRN: [id number]   Subjective: 1 Day Post-Op Procedure(s) (LRB): LUMBAR THREE-FOUR DECOMPRESSION, REMOVAL OF INTRASPINAL EXTRADURAL FACET CYST (N/A) Patient reports pain as mild and moderate.    Objective: Vital signs in last 24 hours: Temp:  [97.3 F (36.3 C)-98.6 F (37 C)] 98.6 F (37 C) (07/26 2001) Pulse Rate:  [59-100] 83 (07/27 0443) Resp:  [11-18] 16 (07/27 0443) BP: (101-138)/(55-91) 102/55 (07/27 0443) SpO2:  [94 %-100 %] 100 % (07/27 0443) Weight:  [72.6 kg] 72.6 kg (07/26 1014)  Intake/Output from previous day: 07/26 0701 - 07/27 0700 In: 2090 [P.O.:840; I.V.:1100; IV Piggyback:100] Out: 1100 [Urine:800; Blood:300] Intake/Output this shift: No intake/output data recorded.  Recent Labs    07/28/21 0850  HGB 13.3   Recent Labs    07/28/21 0850  WBC 6.4  RBC 4.26  HCT 41.4  PLT 276   Recent Labs    07/28/21 0850  NA 142  K 3.3*  CL 109  CO2 26  BUN 19  CREATININE 0.69  GLUCOSE 93  CALCIUM 9.2   No results for input(s): "LABPT", "INR" in the last 72 hours.  Neurologically intact DG Lumbar Spine 2-3 Views  Result Date: 07/30/2021 CLINICAL DATA:  L3-4 decompression EXAM: LUMBAR SPINE - 2-3 VIEW COMPARISON:  07/01/2021 FINDINGS: Initial film shows needles directed at the L2-3 disc level in the pedicle level of L4. Second film shows markers at the inter spinous spaces of L2-3 and L3-4. IMPRESSION: L2-3 and L3-4 inter spinous spaces localized. Electronically Signed   By: Amber Maldonado M.D.   On: 07/30/2021 16:58    Assessment/Plan: 1 Day Post-Op Procedure(s) (LRB): LUMBAR THREE-FOUR DECOMPRESSION, REMOVAL OF INTRASPINAL EXTRADURAL FACET CYST (N/A) Up with therapy. No headache. Slow raise of head . D/c supine order. If mobilizes well with no headache may be discharged this afternoon. If headache she will go back to supine. My cell [phone]  Amber Maldonado 07/31/2021, 7:48 AM

## 2021-07-31 NOTE — Evaluation (Signed)
Physical Therapy Evaluation Patient Details Name: Amber Maldonado MRN: [id number] DOB: 1960/05/09 Today's Date: 07/31/2021  History of Present Illness  Pt is a 61 y/o female s/p L3-4 decompression. PMH includes fibromyalgia and HTN.  Clinical Impression  Patient is s/p above surgery resulting in the deficits listed below (see PT Problem List). Pt with increased pain and only able to tolerate sitting at EOB this session. Requiring min guard for mobility tasks. Educated about back precautions and how to maintain during bed mobility. Anticipate pt will progress well once pain controlled, but feel she will benefit from further acute PT prior to d/c home. Patient will benefit from skilled PT to increase their independence and safety with mobility (while adhering to their precautions) to allow discharge to the venue listed below.        Recommendations for follow up therapy are one component of a multi-disciplinary discharge planning process, led by the attending physician.  Recommendations may be updated based on patient status, additional functional criteria and insurance authorization.  Follow Up Recommendations Home health PT (pending progression)      Assistance Recommended at Discharge Intermittent Supervision/Assistance  Patient can return home with the following  A little help with walking and/or transfers;A little help with bathing/dressing/bathroom;Assistance with cooking/housework;Help with stairs or ramp for entrance;Assist for transportation    Equipment Recommendations None recommended by PT  Recommendations for Other Services       Functional Status Assessment Patient has had a recent decline in their functional status and demonstrates the ability to make significant improvements in function in a reasonable and predictable amount of time.     Precautions / Restrictions Precautions Precautions: Back Precaution Booklet Issued: Yes (comment) Precaution Comments: Reviewed back  precautions with pt. Restrictions Weight Bearing Restrictions: No      Mobility  Bed Mobility Overal bed mobility: Needs Assistance Bed Mobility: Rolling, Sidelying to Sit, Sit to Sidelying Rolling: Supervision Sidelying to sit: Min guard     Sit to sidelying: Min guard General bed mobility comments: Increased time required to perform bed mobiltiy tasks secondary to pain. Requiring increased time in sidelying before coming to sit. Pain preventing further mobility.    Transfers                        Ambulation/Gait                  Stairs            Wheelchair Mobility    Modified Rankin (Stroke Patients Only)       Balance Overall balance assessment: Needs assistance Sitting-balance support: No upper extremity supported, Feet supported Sitting balance-Leahy Scale: Fair                                       Pertinent Vitals/Pain Pain Assessment Pain Assessment: Faces Faces Pain Scale: Hurts whole lot Pain Location: back Pain Descriptors / Indicators: Grimacing, Guarding Pain Intervention(s): Limited activity within patient's tolerance, Monitored during session, Repositioned    Home Living Family/patient expects to be discharged to:: Private residence Living Arrangements: Alone Available Help at Discharge: Family Type of Home: Apartment Home Access: Level entry       Home Layout: One level Home Equipment: Conservation officer, nature (2 wheels);Cane - single point;Shower seat;Grab bars - toilet;Grab bars - tub/shower      Prior Function Prior Level of Function : Independent/Modified  Independent               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        General Comments      Exercises     Assessment/Plan    PT Assessment Patient needs continued PT services  PT Problem List Decreased strength;Decreased balance;Decreased activity tolerance;Decreased mobility;Decreased knowledge of precautions;Pain       PT Treatment Interventions DME instruction;Gait training;Stair training;Functional mobility training;Therapeutic activities;Therapeutic exercise;Balance training;Patient/family education    PT Goals (Current goals can be found in the Care Plan section)  Acute Rehab PT Goals Patient Stated Goal: to decrease pain PT Goal Formulation: With patient Time For Goal Achievement: 08/14/21 Potential to Achieve Goals: Good    Frequency Min 5X/week     Co-evaluation               AM-PAC PT "6 Clicks" Mobility  Outcome Measure Help needed turning from your back to your side while in a flat bed without using bedrails?: A Little Help needed moving from lying on your back to sitting on the side of a flat bed without using bedrails?: A Little Help needed moving to and from a bed to a chair (including a wheelchair)?: A Little Help needed standing up from a chair using your arms (e.g., wheelchair or bedside chair)?: A Little Help needed to walk in hospital room?: A Little Help needed climbing 3-5 steps with a railing? : A Lot 6 Click Score: 17    End of Session   Activity Tolerance: Patient limited by pain Patient left: in bed;with call bell/phone within reach Nurse Communication: Mobility status PT Visit Diagnosis: Unsteadiness on feet (R26.81);Muscle weakness (generalized)  (M62.81)    Time: 3254-9826 PT Time Calculation (min) (ACUTE ONLY): 23 min   Charges:   PT Evaluation $PT Eval Low Complexity: 1 Low PT Treatments $Therapeutic Activity: 8-22 mins        [name], DPT  Acute Rehabilitation Services  Office: [phone]   [name] 07/31/2021, 11:27 AM

## 2021-07-31 NOTE — Progress Notes (Signed)
PT Cancellation Note  Patient Details Name: Amber Maldonado MRN: [id number] DOB: August 02, 1960   Cancelled Treatment:    Reason Eval/Treat Not Completed: Pain limiting ability to participate Pt requesting pain medication before working with PT. Notified RN. Will follow up as schedule allows.   [name], DPT  Acute Rehabilitation Services  Office: [phone]    [name] 07/31/2021, 9:11 AM

## 2021-08-01 DIAGNOSIS — M48062 Spinal stenosis, lumbar region with neurogenic claudication: Secondary | ICD-10-CM | POA: Diagnosis not present

## 2021-08-01 LAB — CBC WITH DIFFERENTIAL/PLATELET
Abs Immature Granulocytes: 0.05 10*3/uL (ref 0.00–0.07)
Basophils Absolute: 0.1 10*3/uL (ref 0.0–0.1)
Basophils Relative: 0 %
Eosinophils Absolute: 0.1 10*3/uL (ref 0.0–0.5)
Eosinophils Relative: 1 %
HCT: 35.9 % — ABNORMAL LOW (ref 36.0–46.0)
Hemoglobin: 12.1 g/dL (ref 12.0–15.0)
Immature Granulocytes: 0 %
Lymphocytes Relative: 18 %
Lymphs Abs: 2.2 10*3/uL (ref 0.7–4.0)
MCH: 32.2 pg (ref 26.0–34.0)
MCHC: 33.7 g/dL (ref 30.0–36.0)
MCV: 95.5 fL (ref 80.0–100.0)
Monocytes Absolute: 1.6 10*3/uL — ABNORMAL HIGH (ref 0.1–1.0)
Monocytes Relative: 14 %
Neutro Abs: 7.9 10*3/uL — ABNORMAL HIGH (ref 1.7–7.7)
Neutrophils Relative %: 67 %
Platelets: 244 10*3/uL (ref 150–400)
RBC: 3.76 MIL/uL — ABNORMAL LOW (ref 3.87–5.11)
RDW: 13.6 % (ref 11.5–15.5)
WBC: 11.9 10*3/uL — ABNORMAL HIGH (ref 4.0–10.5)
nRBC: 0 % (ref 0.0–0.2)

## 2021-08-01 LAB — BASIC METABOLIC PANEL
Anion gap: 7 (ref 5–15)
BUN: 10 mg/dL (ref 6–20)
CO2: 27 mmol/L (ref 22–32)
Calcium: 8.5 mg/dL — ABNORMAL LOW (ref 8.9–10.3)
Chloride: 104 mmol/L (ref 98–111)
Creatinine, Ser: 0.61 mg/dL (ref 0.44–1.00)
GFR, Estimated: >60 mL/min (ref >60)
Glucose, Bld: 121 mg/dL — ABNORMAL HIGH (ref 70–99)
Potassium: 3.5 mmol/L (ref 3.5–5.1)
Sodium: 138 mmol/L (ref 135–145)

## 2021-08-01 LAB — URINALYSIS, ROUTINE W REFLEX MICROSCOPIC
Bilirubin Urine: NEGATIVE
Glucose, UA: NEGATIVE mg/dL
Hgb urine dipstick: NEGATIVE
Ketones, ur: NEGATIVE mg/dL
Leukocytes,Ua: NEGATIVE
Nitrite: NEGATIVE
Protein, ur: NEGATIVE mg/dL
Specific Gravity, Urine: 1.009 (ref 1.005–1.030)
pH: 8 (ref 5.0–8.0)

## 2021-08-01 NOTE — Progress Notes (Signed)
Patient seen in room with PT attempting to ambulate without success.  Patient complained of severe, throbbing headache at back of her head and back of neck while standing and had to return to bed in flat, supine position.  Patient stated that headache was relieved when laying flat, but still had 10/10 back pain at site of incision. Patient also complains of "body aches".  Dr. Lorin Mercy in to assess patient.  Reported severe headache that was relieved while flat, body aches, back pain, and vital signs.  No further orders received.  Dr. Lorin Mercy states that patient will possibly be discharged this evening if labs return without concerns.  Clarified with Dr. Lorin Mercy about concerns regarding severe headache while standing that relieves while laying flat, inability to ambulate due to headache, and dural tear repair.  Dr. Lorin Mercy states that there is no concern for CSF leak.

## 2021-08-01 NOTE — Progress Notes (Signed)
Physical Therapy Treatment Patient Details Name: Amber Maldonado MRN: [id number] DOB: 1960-10-20 Today's Date: 08/01/2021   History of Present Illness Pt is a 61 y/o female s/p L3-4 decompression, cyst removal, and 76m dural tear repair.  PMH includes fibromyalgia and HTN.    PT Comments    Pt received in bed. She reports she ambulated in the hall with her daughter yesterday PM. Overnight she reports developing body aches and headache. Today, she required mod assist sidelying to sit, mod assist sit to stand, min guard assist sidestepping with RW, and min assist sit to sidelying. Upon standing, pt describing her headache as pounding and stating 10/10 back pain. Pt in bed R sidelying at end of session.  RN present in room and aware. HR 116. BP 107/82    Recommendations for follow up therapy are one component of a multi-disciplinary discharge planning process, led by the attending physician.  Recommendations may be updated based on patient status, additional functional criteria and insurance authorization.  Follow Up Recommendations  Home health PT (pending progression)     Assistance Recommended at Discharge Intermittent Supervision/Assistance  Patient can return home with the following A little help with walking and/or transfers;A little help with bathing/dressing/bathroom;Assistance with cooking/housework;Help with stairs or ramp for entrance;Assist for transportation   Equipment Recommendations  None recommended by PT    Recommendations for Other Services       Precautions / Restrictions Precautions Precautions: Back Restrictions Other Position/Activity Restrictions: Pt was initially on bedrest following sx due to dural tear. Watch for symptoms of CSF leak.     Mobility  Bed Mobility Overal bed mobility: Needs Assistance Bed Mobility: Rolling, Sidelying to Sit, Sit to Sidelying Rolling: Supervision Sidelying to sit: Mod assist     Sit to sidelying: Min assist General bed  mobility comments: +rail, increased time    Transfers Overall transfer level: Needs assistance Equipment used: Rolling walker (2 wheels) Transfers: Sit to/from Stand Sit to Stand: Mod assist           General transfer comment: increased time to power up    Ambulation/Gait Ambulation/Gait assistance: Min guard     Gait Pattern/deviations: Step-to pattern       General Gait Details: sidestepping bedside   Stairs             Wheelchair Mobility    Modified Rankin (Stroke Patients Only)       Balance Overall balance assessment: Needs assistance Sitting-balance support: Bilateral upper extremity supported, Feet supported Sitting balance-Leahy Scale: Fair     Standing balance support: Bilateral upper extremity supported, During functional activity, Reliant on assistive device for balance Standing balance-Leahy Scale: Poor                              Cognition Arousal/Alertness: Awake/alert Behavior During Therapy: WFL for tasks assessed/performed Overall Cognitive Status: Within Functional Limits for tasks assessed                                          Exercises      General Comments General comments (skin integrity, edema, etc.): Pt reporting body aches and headache. She reports ambulating with her daughter yesterday PM to the window at the end of the hall and back.      Pertinent Vitals/Pain Pain Assessment Pain Assessment: 0-10 Pain Score: 10-Worst pain  ever Pain Location: back, (6/10 headache) Pain Descriptors / Indicators: Grimacing, Guarding, Headache, Discomfort, Aching, Pounding Pain Intervention(s): Limited activity within patient's tolerance, Repositioned, Patient requesting pain meds-RN notified, Ice applied    Home Living                          Prior Function            PT Goals (current goals can now be found in the care plan section) Acute Rehab PT Goals Patient Stated Goal: to  decrease pain Progress towards PT goals: Not progressing toward goals - comment (body aches and headache)    Frequency    Min 5X/week      PT Plan Current plan remains appropriate    Co-evaluation              AM-PAC PT "6 Clicks" Mobility   Outcome Measure  Help needed turning from your back to your side while in a flat bed without using bedrails?: A Little Help needed moving from lying on your back to sitting on the side of a flat bed without using bedrails?: A Lot Help needed moving to and from a bed to a chair (including a wheelchair)?: A Little Help needed standing up from a chair using your arms (e.g., wheelchair or bedside chair)?: A Lot Help needed to walk in hospital room?: A Little Help needed climbing 3-5 steps with a railing? : A Lot 6 Click Score: 15    End of Session Equipment Utilized During Treatment: Gait belt Activity Tolerance: Patient limited by pain Patient left: in bed;with call bell/phone within reach Nurse Communication: Mobility status PT Visit Diagnosis: Unsteadiness on feet (R26.81);Muscle weakness (generalized) (M62.81)     Time: 9211-9417 PT Time Calculation (min) (ACUTE ONLY): 26 min  Charges:  $Gait Training: 8-22 mins $Therapeutic Activity: 8-22 mins                     Lorrin Goodell, PT  Office # [phone] Pager [phone]    Lorriane Shire 08/01/2021, 10:30 AM

## 2021-08-01 NOTE — Anesthesia Postprocedure Evaluation (Signed)
Anesthesia Post Note  Patient: Sallye Lunz  Procedure(s) Performed: LUMBAR THREE-FOUR DECOMPRESSION, REMOVAL OF INTRASPINAL EXTRADURAL FACET CYST     Patient location during evaluation: PACU Anesthesia Type: General Level of consciousness: awake and alert Pain management: pain level controlled Vital Signs Assessment: post-procedure vital signs reviewed and stable Respiratory status: spontaneous breathing, nonlabored ventilation, respiratory function stable and patient connected to nasal cannula oxygen Cardiovascular status: blood pressure returned to baseline and stable Postop Assessment: no apparent nausea or vomiting Anesthetic complications: no   No notable events documented.  Last Vitals:  Vitals:   08/01/21 0532 08/01/21 0802  BP: 116/68 (!) 94/54  Pulse: 90 (!) 106  Resp:    Temp: 37.2 C 37.3 C  SpO2: 99% 100%    Last Pain:  Vitals:   08/01/21 0802  TempSrc: Oral  PainSc:    Pain Goal: Patients Stated Pain Goal: 2 (08/01/21 0230)                 Lissie Hinesley

## 2021-08-01 NOTE — Progress Notes (Addendum)
Physical Therapy Treatment Patient Details Name: Amber Maldonado MRN: [id number] DOB: 1960/05/31 Today's Date: 08/01/2021   History of Present Illness Pt is a 61 y/o female s/p L3-4 decompression, cyst removal, and 5m dural tear repair.  PMH includes fibromyalgia and HTN.    PT Comments    Pt much improved this PM. Reports only minimal headache that did not change with activity or position. Able to amb 125' in hallway with supervision and walker. Moving slowly due to post op soreness but making good progress.   Recommendations for follow up therapy are one component of a multi-disciplinary discharge planning process, led by the attending physician.  Recommendations may be updated based on patient status, additional functional criteria and insurance authorization.  Follow Up Recommendations  Home health PT (Pt declines and feels she can manage with family)     Assistance Recommended at Discharge Intermittent Supervision/Assistance  Patient can return home with the following A little help with walking and/or transfers;A little help with bathing/dressing/bathroom;Assistance with cooking/housework;Help with stairs or ramp for entrance;Assist for transportation   Equipment Recommendations  None recommended by PT    Recommendations for Other Services       Precautions / Restrictions Precautions Precautions: Back Precaution Booklet Issued: Yes (comment) Precaution Comments: Reviewed back precautions with pt. pt able to state 3/3 back precautions Required Braces or Orthoses:  (no brace) Restrictions Weight Bearing Restrictions: No Other Position/Activity Restrictions: Pt was initially on bedrest following sx due to dural tear. Watch for symptoms of CSF leak.     Mobility  Bed Mobility Overal bed mobility: Needs Assistance Bed Mobility: Rolling, Sidelying to Sit, Sit to Sidelying Rolling: Supervision Sidelying to sit: Min guard     Sit to sidelying: Min assist General bed mobility  comments: Incr time and effort for all. Assist to bring legs back up into bed    Transfers Overall transfer level: Needs assistance Equipment used: Rolling walker (2 wheels) Transfers: Sit to/from Stand Sit to Stand: Min guard           General transfer comment: Assist for safety and incr time and effort    Ambulation/Gait Ambulation/Gait assistance: Supervision Gait Distance (Feet): 125 Feet Assistive device: Rolling walker (2 wheels) Gait Pattern/deviations: Step-through pattern, Decreased step length - left, Decreased step length - right Gait velocity: decr Gait velocity interpretation: <1.31 ft/sec, indicative of household ambulator   General Gait Details: Assist for safety. Steady gait with walker.   Stairs             Wheelchair Mobility    Modified Rankin (Stroke Patients Only)       Balance Overall balance assessment: Needs assistance Sitting-balance support: No upper extremity supported, Feet supported Sitting balance-Leahy Scale: Fair     Standing balance support: Bilateral upper extremity supported, During functional activity, Reliant on assistive device for balance Standing balance-Leahy Scale: Poor Standing balance comment: relies on RW                            Cognition Arousal/Alertness: Awake/alert Behavior During Therapy: WFL for tasks assessed/performed Overall Cognitive Status: Within Functional Limits for tasks assessed                                          Exercises      General Comments General comments (skin integrity, edema, etc.): pts skin  flushed, noted pt skin to be warm to the touch, did reports HA had improved at start of session but then reported new onset of HA at end of session      Pertinent Vitals/Pain Pain Assessment Pain Assessment: Faces Faces Pain Scale: Hurts even more Pain Location: back Pain Descriptors / Indicators: Guarding, Grimacing, Sore Pain Intervention(s): Limited  activity within patient's tolerance, Ice applied    Home Living                          Prior Function            PT Goals (current goals can now be found in the care plan section) Progress towards PT goals: Progressing toward goals    Frequency    Min 5X/week      PT Plan Current plan remains appropriate    Co-evaluation              AM-PAC PT "6 Clicks" Mobility   Outcome Measure  Help needed turning from your back to your side while in a flat bed without using bedrails?: A Little Help needed moving from lying on your back to sitting on the side of a flat bed without using bedrails?: A Little Help needed moving to and from a bed to a chair (including a wheelchair)?: A Little Help needed standing up from a chair using your arms (e.g., wheelchair or bedside chair)?: A Little Help needed to walk in hospital room?: A Little Help needed climbing 3-5 steps with a railing? : A Little 6 Click Score: 18    End of Session Equipment Utilized During Treatment: Gait belt Activity Tolerance: Patient tolerated treatment well Patient left: in bed;with call bell/phone within reach;with nursing/sitter in room   PT Visit Diagnosis: Unsteadiness on feet (R26.81);Muscle weakness (generalized) (M62.81)     Time: 6270-3500 PT Time Calculation (min) (ACUTE ONLY): 27 min  Charges:  $Gait Training: 23-37 mins                     [place] [facility] [place] 08/01/2021, 4:44 PM

## 2021-08-01 NOTE — Progress Notes (Addendum)
Pt c/o flushing, 9/10 HA, and 9/10 back pain PRN medication provided, pt positioned in lateral recumbant and instructed to maintain bedrest until providers rounded; pt endorses understanding. Provider notified by unit director.  Update 1100 Provider states no need for bedrest; ok for pt to mobilize as tolerated.

## 2021-08-01 NOTE — Progress Notes (Signed)
Patient ID: Amber Maldonado, female   DOB: 1960-08-04, 61 y.o.   MRN: [id number]  I reviewed patient's PT progress note from this afternoon.  Still c/o minimal headache.  Spoke with 5[facility] director earlier today and patient was having a headache when flat and worse with standing. Appears she is showing signs of improvement.  Patient did have dural tears requiring repair.  Dr Lorin Mercy is out of town so I did speak with my other attending Dr Louanne Skye who is on call.  He recommended patient remain flat for the evening and he will round on patient in the AM and see if she is ready for d/c tomorrow.  I just spoke with patient by phone to advise her of the plan and she voices understanding.  All questions answered.

## 2021-08-01 NOTE — TOC Initial Note (Signed)
Transition of Care [facility]) - Initial/Assessment Note    Patient Details  Name: Amber Maldonado MRN: [id number] Date of Birth: Jun 19, 1960  Transition of [facility]) CM/SW Contact:    Sharin Mons, RN Phone Number: 08/01/2021, 3:40 PM  Clinical Narrative:           -     s/p L3-4 decompression, cyst removal, and 61m dural tear repair,7/26       From home alone. Supportive daughters, granddaughter. States family to assist with care once d/c to home. Pt states not interested in home health services if needed, thinks she will manage well with family assistance.  PTA independent with ADL's, no DME usage. Pt without DME needs. Family to provide transportation to home once d/c ready. States without Rx med concerns.  TOC team following and will assist with needs...    Expected Discharge Plan: Home/Self Care Barriers to Discharge: Continued Medical Work up   Patient Goals and CMS Choice        Expected Discharge Plan and Services Expected Discharge Plan: Home/Self Care   Discharge Planning Services: CM Consult   Living arrangements for the past 2 months: Single Family Home                                      Prior Living Arrangements/Services Living arrangements for the past 2 months: Single Family Home   Patient language and need for interpreter reviewed:: Yes Do you feel safe going back to the place where you live?: Yes      Need for Family Participation in Patient Care: Yes (Comment) Care giver support system in place?: Yes (comment)   Criminal Activity/Legal Involvement Pertinent to Current Situation/Hospitalization: No - Comment as needed  Activities of Daily Living Home Assistive Devices/Equipment: None ADL Screening (condition at time of admission) Patient's cognitive ability adequate to safely complete daily activities?: Yes Is the patient deaf or have difficulty hearing?: Yes Does the patient have difficulty seeing, even when wearing glasses/contacts?:  No Does the patient have difficulty concentrating, remembering, or making decisions?: No Patient able to express need for assistance with ADLs?: Yes Does the patient have difficulty dressing or bathing?: No Independently performs ADLs?: Yes (appropriate for developmental age) Does the patient have difficulty walking or climbing stairs?: No Weakness of Legs: None Weakness of Arms/Hands: None  Permission Sought/Granted   Permission granted to share information with : Yes, Verbal Permission Granted  Share Information with NAME: JCharmian Muff Daughter  [phone] CAndrena Mews Daughter  [phone]          Emotional Assessment Appearance:: Appears stated age Attitude/Demeanor/Rapport: Engaged Affect (typically observed): Accepting Orientation: : Oriented to Self, Oriented to Place, Oriented to  Time, Oriented to Situation Alcohol / Substance Use: Not Applicable Psych Involvement: No (comment)  Admission diagnosis:  Lumbar stenosis [M48.061] Patient Active Problem List   Diagnosis Date Noted   Lumbar stenosis 07/30/2021   Spinal stenosis of lumbar region 07/02/2021   Low back pain 05/15/2021   Right thyroid nodule 02/07/2021   Fibromyalgia 02/07/2021   Vitamin B12 deficiency 02/07/2021   GERD (gastroesophageal reflux disease) 02/07/2021   COVID-19 virus infection 02/06/2021   Hypomagnesemia 02/06/2021   Hypokalemia 02/06/2021   Sepsis due to acute pyelonephritis 02/06/2021   Sepsis ([facility] 02/06/2021   Palpitations 04/12/2020   Aortic atherosclerosis ([place] 04/12/2020   SVT (supraventricular tachycardia) ([place] 02/01/2020   Essential hypertension  02/01/2020   PCP:  [name], MD Pharmacy[facility]   [facility] [place], [place] [address] [place] [address] Phone: [phone] Fax: [phone]     Social Determinants of Health (SDOH) Interventions    Readmission  Risk Interventions     No data to display

## 2021-08-01 NOTE — Progress Notes (Signed)
Patient ID: Amber Maldonado, female   DOB: 1960-11-04, 61 y.o.   MRN: [id number]   Subjective: 2 Days Post-Op Procedure(s) (LRB): LUMBAR THREE-FOUR DECOMPRESSION, REMOVAL OF INTRASPINAL EXTRADURAL FACET CYST (N/A) Patient reports pain as moderate.  C/O whole body pain with HA. Did not change with supine. No nausea. Dressing and incision completely dry. " I felt like this when I got septic when I had pyelonephritis.    Sometimes I just get pain all over. "  Objective: Vital signs in last 24 hours: Temp:  [98.2 F (36.8 C)-99.2 F (37.3 C)] 98.8 F (37.1 C) (07/28 1009) Pulse Rate:  [82-116] 116 (07/28 1009) Resp:  [16-17] 16 (07/27 2006) BP: (94-122)/(54-83) 107/82 (07/28 1009) SpO2:  [99 %-100 %] 100 % (07/28 1009)  Intake/Output from previous day: No intake/output data recorded. Intake/Output this shift: No intake/output data recorded.  No results for input(s): "HGB" in the last 72 hours. No results for input(s): "WBC", "RBC", "HCT", "PLT" in the last 72 hours. No results for input(s): "NA", "K", "CL", "CO2", "BUN", "CREATININE", "GLUCOSE", "CALCIUM" in the last 72 hours. No results for input(s): "LABPT", "INR" in the last 72 hours.  Neurologically intact No results found.  Assessment/Plan: 2 Days Post-Op Procedure(s) (LRB): LUMBAR THREE-FOUR DECOMPRESSION, REMOVAL OF INTRASPINAL EXTRADURAL FACET CYST (N/A) Up with therapy,  does not need to be at bedrest. CBC, BMET, UA ordered.  Aching all over , HA not related to position. Mostly posterior , may be from using walker. At this point not ready to go home this AM.   [name] 08/01/2021, 11:18 AM

## 2021-08-01 NOTE — Progress Notes (Signed)
   08/01/21 0802  Assess: MEWS Score  Temp 99.2 F (37.3 C)  BP (!) 94/54  MAP (mmHg) (!) 63  Pulse Rate (!) 106  SpO2 100 %  O2 Device Room Air  Assess: MEWS Score  MEWS Temp 0  MEWS Systolic 1  MEWS Pulse 1  MEWS RR 0  MEWS LOC 0  MEWS Score 2  MEWS Score Color Yellow  Assess: if the MEWS score is Yellow or Red  Were vital signs taken at a resting state? Yes  Focused Assessment Change from prior assessment (see assessment flowsheet) (new HA upon standing)  Does the patient meet 2 or more of the SIRS criteria? Yes  Does the patient have a confirmed or suspected source of infection? No  MEWS guidelines implemented *See Row Information* Yes  Treat  MEWS Interventions Administered prn meds/treatments;Escalated (See documentation below) (unit director contacted midlevel provider)  Pain Scale 0-10  Pain Score 9  Pain Type Acute pain  Pain Location Head  Pain Orientation Posterior  Pain Onset With Activity  Pain Intervention(s) Medication (See eMAR)  Multiple Pain Sites Yes  2nd Pain Site  Pain Score 10  Pain Location Back  Take Vital Signs  Increase Vital Sign Frequency  Yellow: Q 2hr X 2 then Q 4hr X 2, if remains yellow, continue Q 4hrs  Escalate  MEWS: Escalate Yellow: discuss with charge nurse/RN and consider discussing with provider and RRT  Assess: SIRS CRITERIA  SIRS Temperature  0  SIRS Pulse 1  SIRS Respirations  0  SIRS WBC 1  SIRS Score Sum  2   MEWS Guidelines - (patients age 42 and over)

## 2021-08-01 NOTE — Progress Notes (Signed)
Occupational Therapy Treatment Patient Details Name: Amber Maldonado MRN: [id number] DOB: 09/16/1960 Today's Date: 08/01/2021   History of present illness Pt is a 61 y/o female s/p L3-4 decompression, cyst removal, and 77m dural tear repair.  PMH includes fibromyalgia and HTN.   OT comments  Pt greeted supine in bed agreeable to OT intervention. Session focus on BADL reeducation, functional mobility, AE education, increasing overall activity tolerance,and decreasing overall caregiver burden. Pt with good recall of 3/3 back precautions, briefly reviewed AE for bathing and dressing to maintain back precautions. Pt currently requires min guard - MIN A for ambulatory ADL transfer with RW, supervision for 3/3 toileting tasks with + time, and set- up for seated grooming tasks d/t pain and general malaise. ADL participation mostly limited by pain and general malaise. Pt was noted to be flushed with skin warm to the touch, RN aware. Pt would continue to benefit from skilled occupational therapy while admitted and after d/c to address the below listed limitations in order to improve overall functional mobility and facilitate independence with BADL participation. DC plan remains appropriate, will follow acutely per POC.                        Recommendations for follow up therapy are one component of a multi-disciplinary discharge planning process, led by the attending physician.  Recommendations may be updated based on patient status, additional functional criteria and insurance authorization.    Follow Up Recommendations  Home health OT    Assistance Recommended at Discharge Frequent or constant Supervision/Assistance  Patient can return home with the following  A little help with walking and/or transfers;A lot of help with bathing/dressing/bathroom;Assistance with cooking/housework;Assist for transportation;Help with stairs or ramp for entrance   Equipment Recommendations  BSC/3in1    Recommendations  for Other Services      Precautions / Restrictions Precautions Precautions: Back Precaution Booklet Issued: Yes (comment) Precaution Comments: Reviewed back precautions with pt. pt able to state 3/3 back precautions Required Braces or Orthoses:  (no brace) Restrictions Weight Bearing Restrictions: No Other Position/Activity Restrictions: Pt was initially on bedrest following sx due to dural tear. Watch for symptoms of CSF leak.       Mobility Bed Mobility Overal bed mobility: Needs Assistance Bed Mobility: Rolling, Sidelying to Sit Rolling: Supervision (+ time, use of bed rail) Sidelying to sit: Min assist       General bed mobility comments: +rail, increased time and effort, education provided on possibly purchasing bed rail for home    Transfers Overall transfer level: Needs assistance Equipment used: Rolling walker (2 wheels) Transfers: Sit to/from Stand Sit to Stand: Min assist, Min guard, From elevated surface           General transfer comment: + time and effort to power up but no more than MIN A for steadying assist     Balance Overall balance assessment: Needs assistance Sitting-balance support: Single extremity supported, Feet supported Sitting balance-Leahy Scale: Fair     Standing balance support: Bilateral upper extremity supported, During functional activity, Reliant on assistive device for balance Standing balance-Leahy Scale: Poor                             ADL either performed or assessed with clinical judgement   ADL Overall ADL's : Needs assistance/impaired     Grooming: Oral care;Wash/dry face;Sitting;Set up Grooming Details (indicate cue type and reason): sitting at sink  d/t pain       Lower Body Bathing Details (indicate cue type and reason): reviewed compensatory AE for LB bathing to compensate for back precautions       Lower Body Dressing Details (indicate cue type and reason): brief education provided on LB AE for  dressing, education provided on pt being allowed to figure four if able Toilet Transfer: Minimal assistance;Rolling walker (2 wheels);Ambulation;Grab Information systems manager Details (indicate cue type and reason): MIN A for increased safety d/t general malaise Toileting- Clothing Manipulation and Hygiene: Supervision/safety;Sitting/lateral lean Toileting - Clothing Manipulation Details (indicate cue type and reason): education provided on all compensatory methods for pericare d/t back precautions   Tub/Shower Transfer Details (indicate cue type and reason): pt reports having shower seat Functional mobility during ADLs: Minimal assistance;Min guard;Rolling walker (2 wheels) General ADL Comments: ADL participation limited by pain and general malaise    Extremity/Trunk Assessment Upper Extremity Assessment Upper Extremity Assessment: Overall WFL for tasks assessed   Lower Extremity Assessment Lower Extremity Assessment: Defer to PT evaluation   Cervical / Trunk Assessment Cervical / Trunk Assessment: Back Surgery    Vision Baseline Vision/History: 1 Wears glasses Patient Visual Report: No change from baseline Vision Assessment?: No apparent visual deficits   Perception Perception Perception: Within Functional Limits   Praxis Praxis Praxis: Intact    Cognition Arousal/Alertness: Awake/alert Behavior During Therapy: WFL for tasks assessed/performed Overall Cognitive Status: Within Functional Limits for tasks assessed                                          Exercises      Shoulder Instructions       General Comments pts skin flushed, noted pt skin to be warm to the touch, did reports HA had improved at start of session but then reported new onset of HA at end of session    Pertinent Vitals/ Pain       Pain Assessment Pain Assessment: Faces Faces Pain Scale: Hurts even more Pain Location: back Pain Descriptors / Indicators: Discomfort,  Grimacing, Guarding, Pressure Pain Intervention(s): Limited activity within patient's tolerance, Monitored during session, Repositioned, Premedicated before session  Home Living                                          Prior Functioning/Environment              Frequency  Min 2X/week        Progress Toward Goals  OT Goals(current goals can now be found in the care plan section)  Progress towards OT goals: Progressing toward goals  Acute Rehab OT Goals Patient Stated Goal: to feel better OT Goal Formulation: With patient Time For Goal Achievement: 08/14/21 Potential to Achieve Goals: Good  Plan Discharge plan remains appropriate;Frequency remains appropriate    Co-evaluation                 AM-PAC OT "6 Clicks" Daily Activity     Outcome Measure   Help from another person eating meals?: None Help from another person taking care of personal grooming?: A Little Help from another person toileting, which includes using toliet, bedpan, or urinal?: A Little Help from another person bathing (including washing, rinsing, drying)?: A Lot Help from another person to put on  and taking off regular upper body clothing?: A Little Help from another person to put on and taking off regular lower body clothing?: A Lot 6 Click Score: 17    End of Session Equipment Utilized During Treatment: Rolling walker (2 wheels);Gait belt  OT Visit Diagnosis: Other abnormalities of gait and mobility (R26.89);Pain Pain - part of body:  (back)   Activity Tolerance Patient tolerated treatment well   Patient Left in chair;with call bell/phone within reach   Nurse Communication Mobility status        Time: 1123-1207 OT Time Calculation (min): 44 min  Charges: OT General Charges $OT Visit: 1 Visit OT Treatments $Self Care/Home Management : 38-52 mins  Amber Alto., COTA/L Acute Rehabilitation Services [phone]   Precious Haws 08/01/2021, 2:03 PM

## 2021-08-02 DIAGNOSIS — M48062 Spinal stenosis, lumbar region with neurogenic claudication: Secondary | ICD-10-CM | POA: Diagnosis not present

## 2021-08-02 MED ORDER — OXYCODONE HCL 5 MG PO TABS
5.0000 mg | ORAL_TABLET | ORAL | Status: DC | PRN
  Administered 2021-08-02 (×3): 10 mg via ORAL
  Filled 2021-08-02 (×3): qty 2

## 2021-08-02 NOTE — Plan of Care (Signed)

## 2021-08-02 NOTE — Progress Notes (Addendum)
Called  on call provider x3 and received voicemail which was full. Called the answering service and they paged on call MD.  [phone]  Spoke w/ Dr Lorin Mercy for re order of oxy 5-'10mg'$ .

## 2021-08-02 NOTE — Plan of Care (Signed)
  Problem: Education: Goal: Knowledge of General Education information will improve Description: Including pain rating scale, medication(s)/side effects and non-pharmacologic comfort measures Outcome: Progressing   Problem: Clinical Measurements: Goal: Respiratory complications will improve Outcome: Progressing Goal: Cardiovascular complication will be avoided Outcome: Progressing   Problem: Activity: Goal: Risk for activity intolerance will decrease Outcome: Progressing   Problem: Nutrition: Goal: Adequate nutrition will be maintained Outcome: Progressing   Problem: Coping: Goal: Level of anxiety will decrease Outcome: Progressing   Problem: Elimination: Goal: Will not experience complications related to bowel motility Outcome: Progressing   Problem: Pain Managment: Goal: General experience of comfort will improve Outcome: Progressing   Problem: Safety: Goal: Ability to remain free from injury will improve Outcome: Progressing   Problem: Skin Integrity: Goal: Risk for impaired skin integrity will decrease Outcome: Progressing

## 2021-08-02 NOTE — Progress Notes (Signed)
Physical Therapy Treatment Patient Details Name: Amber Maldonado MRN: [id number] DOB: [birth year] Today's Date: 08/02/2021   History of Present Illness Pt is a 61 y/o female s/p L3-4 decompression, cyst removal, and 10m dural tear repair.  PMH includes fibromyalgia and HTN.    PT Comments    Pt with no concerns about ability to safely discharge home with family support. Able to demonstrated safe transfers with min guard and ambulation of 1046fwith supervision and RW. Pt educated on safe car transfers and progression of mobility upon return home. Pt with no further questions or concerns at this time. Recommending HHPT upon discharge to maximize functional return to independence.    Recommendations for follow up therapy are one component of a multi-disciplinary discharge planning process, led by the attending physician.  Recommendations may be updated based on patient status, additional functional criteria and insurance authorization.  Follow Up Recommendations  Home health PT     Assistance Recommended at Discharge Intermittent Supervision/Assistance  Patient can return home with the following A little help with walking and/or transfers;A little help with bathing/dressing/bathroom;Assistance with cooking/housework;Help with stairs or ramp for entrance;Assist for transportation   Equipment Recommendations  None recommended by PT    Recommendations for Other Services       Precautions / Restrictions Precautions Precautions: Back Precaution Booklet Issued: Yes (comment) Required Braces or Orthoses:  (no brace) Restrictions Weight Bearing Restrictions: No     Mobility  Bed Mobility               General bed mobility comments: pt seated EOB upon arrival    Transfers Overall transfer level: Needs assistance Equipment used: Rolling walker (2 wheels) Transfers: Sit to/from Stand Sit to Stand: Min guard           General transfer comment: min guard for safety, increased  time and effort needed    Ambulation/Gait Ambulation/Gait assistance: Supervision Gait Distance (Feet): 100 Feet Assistive device: Rolling walker (2 wheels) Gait Pattern/deviations: Step-through pattern, Decreased stride length Gait velocity: decreased Gait velocity interpretation: <1.8 ft/sec, indicate of risk for recurrent falls   General Gait Details: slow cautious gait due to pain, cues for relaxing shoulders. demonstrated good posture and gait pattern   Stairs             Wheelchair Mobility    Modified Rankin (Stroke Patients Only)       Balance Overall balance assessment: Needs assistance Sitting-balance support: No upper extremity supported, Feet supported Sitting balance-Leahy Scale: Good     Standing balance support: Bilateral upper extremity supported, During functional activity, Reliant on assistive device for balance Standing balance-Leahy Scale: Poor Standing balance comment: heavy reliance on RW                            Cognition Arousal/Alertness: Awake/alert Behavior During Therapy: WFL for tasks assessed/performed Overall Cognitive Status: Within Functional Limits for tasks assessed                                          Exercises      General Comments        Pertinent Vitals/Pain Pain Assessment Pain Assessment: Faces Faces Pain Scale: Hurts even more Pain Location: back Pain Descriptors / Indicators: Guarding, Grimacing, Sore Pain Intervention(s): Limited activity within patient's tolerance, Monitored during session    Home Living  Prior Function            PT Goals (current goals can now be found in the care plan section) Acute Rehab PT Goals Patient Stated Goal: to decrease pain PT Goal Formulation: With patient Time For Goal Achievement: 08/14/21 Potential to Achieve Goals: Good Progress towards PT goals: Progressing toward goals    Frequency    Min  5X/week      PT Plan Current plan remains appropriate    Co-evaluation              AM-PAC PT "6 Clicks" Mobility   Outcome Measure  Help needed turning from your back to your side while in a flat bed without using bedrails?: A Little Help needed moving from lying on your back to sitting on the side of a flat bed without using bedrails?: A Little Help needed moving to and from a bed to a chair (including a wheelchair)?: A Little Help needed standing up from a chair using your arms (e.g., wheelchair or bedside chair)?: A Little Help needed to walk in hospital room?: A Little Help needed climbing 3-5 steps with a railing? : A Lot 6 Click Score: 17    End of Session   Activity Tolerance: Patient tolerated treatment well Patient left: in chair;with call bell/phone within reach;with family/visitor present (with OT) Nurse Communication: Mobility status PT Visit Diagnosis: Unsteadiness on feet (R26.81);Muscle weakness (generalized) (M62.81)     Time: 1610-9604 PT Time Calculation (min) (ACUTE ONLY): 12 min  Charges:  $Therapeutic Activity: 8-22 mins                    Mackie Pai, SPT Acute Rehabilitation Services  Office: [phone]    Mackie Pai 08/02/2021, 10:51 AM

## 2021-08-02 NOTE — Progress Notes (Signed)
Occupational Therapy Treatment Patient Details Name: Amber Maldonado MRN: [id number] DOB: 24-Jan-1960 Today's Date: 08/02/2021   History of present illness Pt is a 61 y/o female s/p L3-4 decompression, cyst removal, and 37m dural tear repair.  PMH includes fibromyalgia and HTN.   OT comments  Pt is making limited progress towards her goals, she remains limited by back pain. Overall she required min G for all functional mobility with RW and is heavily reliant on the walker when standing. Pt completed grooming and ADLs at the sink needing min A for UB dressing, and mod A for all LB tasks. Pt extremely slow and deliberate during functional tasks and had increased nausea throughout the session. Provided education on back precautions and compensatory techniques throughout ADLs. Pt continues to benefit, d/c recommendation remains appropriate. Encouraged 24/7 direct supervision/physical assist initially, pt verbalized understanding.    Recommendations for follow up therapy are one component of a multi-disciplinary discharge planning process, led by the attending physician.  Recommendations may be updated based on patient status, additional functional criteria and insurance authorization.    Follow Up Recommendations  Home health OT    Assistance Recommended at Discharge Frequent or constant Supervision/Assistance  Patient can return home with the following  A little help with walking and/or transfers;A lot of help with bathing/dressing/bathroom;Assistance with cooking/housework;Assist for transportation;Help with stairs or ramp for entrance   Equipment Recommendations  BSC/3in1       Precautions / Restrictions Precautions Precautions: Back Precaution Booklet Issued: Yes (comment) Precaution Comments: Reviewed back precautions with pt. pt able to state 3/3 back precautions Required Braces or Orthoses:  (no brace) Restrictions Weight Bearing Restrictions: No       Mobility Bed Mobility                General bed mobility comments: OOB upon arrival    Transfers Overall transfer level: Needs assistance Equipment used: Rolling walker (2 wheels) Transfers: Sit to/from Stand Sit to Stand: Min guard                 Balance Overall balance assessment: Needs assistance Sitting-balance support: Single extremity supported, Feet supported Sitting balance-Leahy Scale: Fair Sitting balance - Comments: needs at least 1 UE supported in sitting for pain managemnet   Standing balance support: Bilateral upper extremity supported, During functional activity, Reliant on assistive device for balance Standing balance-Leahy Scale: Poor             ADL either performed or assessed with clinical judgement   ADL Overall ADL's : Needs assistance/impaired     Grooming: Supervision/safety;Sitting Grooming Details (indicate cue type and reason): sitting at the sink Upper Body Bathing: Supervision/ safety;Sitting Upper Body Bathing Details (indicate cue type and reason): at the sink Lower Body Bathing: Moderate assistance;Sit to/from stand Lower Body Bathing Details (indicate cue type and reason): assist for distal LEs, and rear peri area. Upper Body Dressing : Minimal assistance Upper Body Dressing Details (indicate cue type and reason): min A for management of bra                 Functional mobility during ADLs: Min guard;Rolling walker (2 wheels) General ADL Comments: pt limited by pain and back precautions, she moves extrememly slow and deliberately. mod A for all LB tasks to maintain back precautions and pain management. Pt heavily reliant on RW in standing    Extremity/Trunk Assessment Upper Extremity Assessment Upper Extremity Assessment: Overall WFL for tasks assessed   Lower Extremity Assessment Lower Extremity Assessment:  Defer to PT evaluation        Vision   Vision Assessment?: No apparent visual deficits   Perception Perception Perception: Within  Functional Limits   Praxis Praxis Praxis: Intact    Cognition Arousal/Alertness: Awake/alert Behavior During Therapy: WFL for tasks assessed/performed, Flat affect Overall Cognitive Status: Within Functional Limits for tasks assessed                        General Comments VSS on RA, daughter present. Pt with a lot of pain and nausea during session    Pertinent Vitals/ Pain       Pain Assessment Pain Assessment: Faces Faces Pain Scale: Hurts whole lot Pain Location: back Pain Descriptors / Indicators: Guarding, Grimacing, Sore Pain Intervention(s): Limited activity within patient's tolerance, Monitored during session   Frequency  Min 2X/week        Progress Toward Goals  OT Goals(current goals can now be found in the care plan section)  Progress towards OT goals: Progressing toward goals  Acute Rehab OT Goals Patient Stated Goal: to go home OT Goal Formulation: With patient Time For Goal Achievement: 08/14/21 Potential to Achieve Goals: Good ADL Goals Pt Will Perform Grooming: with modified independence;standing Pt Will Perform Lower Body Dressing: with modified independence;sit to/from stand;with adaptive equipment Pt Will Transfer to Toilet: with modified independence;ambulating Pt Will Perform Toileting - Clothing Manipulation and hygiene: with modified independence;sit to/from stand Pt Will Perform Tub/Shower Transfer: with modified independence;Tub transfer;ambulating;shower seat;grab bars;rolling walker  Plan Discharge plan remains appropriate;Frequency remains appropriate       AM-PAC OT "6 Clicks" Daily Activity     Outcome Measure   Help from another person eating meals?: None Help from another person taking care of personal grooming?: A Little Help from another person toileting, which includes using toliet, bedpan, or urinal?: A Little Help from another person bathing (including washing, rinsing, drying)?: A Lot Help from another person to put  on and taking off regular upper body clothing?: A Little Help from another person to put on and taking off regular lower body clothing?: A Lot 6 Click Score: 17    End of Session Equipment Utilized During Treatment: Rolling walker (2 wheels);Gait belt  OT Visit Diagnosis: Other abnormalities of gait and mobility (R26.89);Pain   Activity Tolerance Patient tolerated treatment well   Patient Left in chair;with call bell/phone within reach   Nurse Communication Mobility status        Time: 6553-7482 OT Time Calculation (min): 34 min  Charges: OT General Charges $OT Visit: 1 Visit OT Treatments $Self Care/Home Management : 23-37 mins   Mickell Birdwell A Kaytee Taliercio 08/02/2021, 11:13 AM

## 2021-08-02 NOTE — Hospital Course (Signed)
     Subjective: 3 Days Post-Op Procedure(s) (LRB): LUMBAR THREE-FOUR DECOMPRESSION, REMOVAL OF INTRASPINAL EXTRADURAL FACET CYST (N/A) Lumbar decompressive laminectomy on Wed 2/83 with complication of CSF leak repaired primarily. Did PT and was to be discharged yesterday then had severe HA. She has been kept down for a day and the HAs are resolved, no nuchal signs and no photophobia. Meds were sent to her pharmacy and she relates she is ready to go home. 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   Patient reports pain as moderate.    Objective:   VITALS:  Temp:  [98 F (36.7 C)-98.8 F (37.1 C)] 98 F (36.7 C) (07/29 0746) Pulse Rate:  [82-116] 98 (07/29 0746) Resp:  [14-18] 18 (07/29 0746) BP: (106-127)/(57-86) 106/76 (07/29 0746) SpO2:  [98 %-100 %] 100 % (07/29 0746)  Neurologically intact ABD soft Neurovascular intact Sensation intact distally Intact pulses distally Dorsiflexion/Plantar flexion intact Incision: dressing C/D/I, no drainage, and No incisional fluctuance or drainage. No cellulitis present Compartment soft   LABS Recent Labs    08/01/21 1053  HGB 12.1  WBC 11.9*  PLT 244   Recent Labs    08/01/21 1053  NA 138  K 3.5  CL 104  CO2 27  BUN 10  CREATININE 0.61  GLUCOSE 121*   No results for input(s): "LABPT", "INR" in the last 72 hours.   Assessment/Plan: 3 Days Post-Op Procedure(s) (LRB): LUMBAR THREE-FOUR DECOMPRESSION, REMOVAL OF INTRASPINAL EXTRADURAL FACET CYST (N/A) No symptoms or signs of persistent dural leak or CSF leak. HAs are resolved.   Advance diet Up with therapy Discharge home with home health If any incisional drainage she will contact us immediately, otherwise dry dressing. May bath or shower.  Amber Maldonado 08/02/2021, 9:23 AM

## 2021-08-06 ENCOUNTER — Encounter: Payer: Self-pay | Admitting: Orthopaedic Surgery

## 2021-08-06 ENCOUNTER — Ambulatory Visit (INDEPENDENT_AMBULATORY_CARE_PROVIDER_SITE_OTHER): Payer: Medicare Other | Admitting: Orthopaedic Surgery

## 2021-08-06 DIAGNOSIS — Z9889 Other specified postprocedural states: Secondary | ICD-10-CM

## 2021-08-06 MED ORDER — OXYCODONE-ACETAMINOPHEN 5-325 MG PO TABS: 1.0000 | ORAL_TABLET | ORAL | 0 refills | Status: DC | PRN

## 2021-08-06 NOTE — Progress Notes (Signed)
   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  Chief Complaint:  Chief Complaint  Patient presents with   Lower Back - Routine Post Op   Visit Diagnoses:  1. Status post lumbar spine surgery for decompression of spinal cord     Plan: ROV 3 wks.  Follow-Up Instructions: No follow-ups on file.   Orders:  No orders of the defined types were placed in this encounter.  No orders of the defined types were placed in this encounter.   Imaging: No results found.  PMFS History: Patient Active Problem List   Diagnosis Date Noted   Status post lumbar spine surgery for decompression of spinal cord 08/06/2021   Low back pain 05/15/2021   Right thyroid nodule 02/07/2021   Fibromyalgia 02/07/2021   Vitamin B12 deficiency 02/07/2021   GERD (gastroesophageal reflux disease) 02/07/2021   COVID-19 virus infection 02/06/2021   Hypomagnesemia 02/06/2021   Hypokalemia 02/06/2021   Sepsis due to acute pyelonephritis 02/06/2021   Sepsis ([place]) 02/06/2021   Palpitations 04/12/2020   Aortic atherosclerosis ([place]) 04/12/2020   SVT (supraventricular tachycardia) ([place]) 02/01/2020   Essential hypertension 02/01/2020   Past Medical History:  Diagnosis Date   Arthritis    Cancer ([place])    right leg melanoma   Dysrhythmia    Fibromyalgia    GERD (gastroesophageal reflux disease)    Hypertension    Irritable bowel disease    SVT (supraventricular tachycardia) ([facility])    Thyroid nodule    04/02/21 right FNA: benign follicular nodule (Bethesda Category II)    History reviewed. No pertinent family history.  Past Surgical History:  Procedure Laterality Date   ABDOMINAL HYSTERECTOMY     HIP ARTHROSCOPY  2010   clean out arthiritis   KNEE ARTHROPLASTY     LUMBAR  LAMINECTOMY/DECOMPRESSION MICRODISCECTOMY N/A 07/30/2021   Procedure: LUMBAR THREE-FOUR DECOMPRESSION, REMOVAL OF INTRASPINAL EXTRADURAL FACET CYST;  Surgeon: [name], MD;  Location: [facility];  Service: Orthopedics;  Laterality: N/A;   Social History   Occupational History   Not on file  Tobacco Use   Smoking status: Former    Types: Cigarettes    Quit date: 1994    Years since quitting: 29.6   Smokeless tobacco: Never  Substance and Sexual Activity   Alcohol use: Yes    Comment: occassional   Drug use: Never   Sexual activity: Not Currently

## 2021-08-07 ENCOUNTER — Encounter (HOSPITAL_COMMUNITY)
Admission: RE | Admit: 2021-08-07 | Discharge: 2021-08-07 | Disposition: A | Discharge status: Home / Self Care | Payer: Medicare Other | Source: Ambulatory Visit | Attending: Family Medicine | Admitting: Family Medicine

## 2021-08-20 ENCOUNTER — Ambulatory Visit: Payer: Self-pay | Admitting: Surgery

## 2021-08-20 NOTE — H&P (Signed)
REFERRING PHYSICIAN:  Shamleffer, Carlyle Basques*   PROVIDER:  Lashell Moffitt Charlotta Newton, MD     Subjective    Chief Complaint: New Consultation (Right thyroid nodule)   History of Present Illness:   Patient is referred by Dr. Vivia Ewing for surgical evaluation and recommendations regarding a dominant right thyroid nodule.  Patient had undergone a CT scan of the chest for an unrelated issue.  Incidental finding was made of a right sided thyroid nodule.  Patient subsequently underwent a thyroid ultrasound in March 2023.  This demonstrated a normal-sized thyroid gland with a dominant nodule in the right lobe measuring 3.9 cm in greatest dimension.  This was felt to be mildly suspicious and fine-needle aspiration biopsy was recommended.  Biopsy was performed on April 01, 2021.  This returned as a benign follicular nodule, Bethesda category II.  Patient has normal thyroid function with a recent TSH level of 0.87.  Patient has never been on thyroid medication.  She has had no prior head or neck surgery.  There is a history of medical thyroid disease in the patient's mother who takes thyroid medication.  There is no family history of thyroid cancer.  Patient has developed mild compressive symptoms.  She is concerned over the growth in size of the nodule in the right thyroid lobe.  She describes a globus sensation.  She is interested in proceeding with right thyroid lobectomy.     Review of Systems: A complete review of systems was obtained from the patient.  I have reviewed this information and discussed as appropriate with the patient.  See HPI as well for other ROS.   ROS      Medical History: Past Medical History      Past Medical History:  Diagnosis Date   Arthritis     Asthma, unspecified asthma severity, unspecified whether complicated, unspecified whether persistent     History of cancer     Hypertension             Patient Active Problem List  Diagnosis   Thyroid nodule       Past Surgical History       Past Surgical History:  Procedure Laterality Date   REPLACEMENT TOTAL KNEE Left 2016   LIMB SPARING RESECTION HIP W/ SADDLE JOINT REPLACEMENT Left 2022   HYSTERECTOMY            Allergies      Allergies  Allergen Reactions   Cortisone Other (See Comments)        No current outpatient medications on file prior to visit.    No current facility-administered medications on file prior to visit.      Family History       Family History  Problem Relation Age of Onset   Skin cancer Mother     High blood pressure (Hypertension) Mother     Hyperlipidemia (Elevated cholesterol) Mother     Skin cancer Brother     High blood pressure (Hypertension) Brother     Hyperlipidemia (Elevated cholesterol) Brother          Social History       Tobacco Use  Smoking Status Never  Smokeless Tobacco Never      Social History  Social History         Socioeconomic History   Marital status: Legally Separated  Tobacco Use   Smoking status: Never   Smokeless tobacco: Never  Substance and Sexual Activity   Alcohol use: Yes  Alcohol/week: 4.0 standard drinks      Types: 4 Glasses of wine per week   Drug use: Never        Objective:         Vitals:      BP: (!) 140/90  Pulse: 110  Temp: 36.8 C (98.3 F)  SpO2: 97%  Weight: 74.6 kg (164 lb 6.4 oz)  Height: 152.4 cm (5')    Body mass index is 32.11 kg/m.   Physical Exam    GENERAL APPEARANCE Comfortable, no acute issues Development: normal Gross deformities: none   SKIN Rash, lesions, ulcers: none Induration, erythema: none Nodules: none palpable   EYES Conjunctiva and lids: normal Pupils: equal and reactive   EARS, NOSE, MOUTH, THROAT External ears: no lesion or deformity External nose: no lesion or deformity Hearing: grossly normal   NECK Symmetric: no Trachea: midline Thyroid: On palpation there is a dominant nodule in the mid right thyroid lobe.  This is smooth,  discrete, mobile with swallowing, and nontender.  Patient on the left reveals no significant nodularity.  There is no associated lymphadenopathy.   CHEST Respiratory effort: normal Retraction or accessory muscle use: no Breath sounds: normal bilaterally Rales, rhonchi, wheeze: none   CARDIOVASCULAR Auscultation: regular rhythm, normal rate Murmurs: none Pulses: radial pulse 2+ palpable Lower extremity edema: none   ABDOMEN Not assessed   GENITOURINARY Not assessed   MUSCULOSKELETAL Station and gait: normal Digits and nails: no clubbing or cyanosis Muscle strength: grossly normal all extremities Range of motion: grossly normal all extremities Deformity: none   LYMPHATIC Cervical: none palpable Supraclavicular: none palpable   PSYCHIATRIC Oriented to person, place, and time: yes Mood and affect: normal for situation Judgment and insight: appropriate for situation       Assessment and Plan:  Diagnoses and all orders for this visit:   Thyroid nodule   Patient is referred by her endocrinologist for surgical evaluation and recommendations regarding a dominant left thyroid nodule.   Patient provided with a copy of "The Thyroid Book: Medical and Surgical Treatment of Thyroid Problems", published by [employer], 16 pages.  Book reviewed and explained to patient during visit today.   Today we reviewed the clinical history, the ultrasound, and the fine-needle aspiration biopsy cytopathology report.  Patient does have a benign right sided thyroid nodule measuring 3.9 cm in greatest dimension.  She has developed mild compressive symptoms and describes a globus sensation.  She is interested in proceeding with surgical resection.  There is a small 9 mm spongiform nodule on the left side on ultrasound.   After discussion, the patient would like to proceed with right thyroid lobectomy.  We discussed the procedure.  We discussed the risk and benefits including the risk of recurrent  laryngeal nerve injury and injury to parathyroid glands.  We discussed the size and location of the surgical incision.  We discussed the hospital stay to be anticipated.  We discussed the potential need for thyroid hormone supplementation.  Patient understands and wishes to proceed with surgery in the near future.   The risks and benefits of the procedure have been discussed at length with the patient.  The patient understands the proposed procedure, potential alternative treatments, and the course of recovery to be expected.  All of the patient's questions have been answered at this time.  The patient wishes to proceed with surgery.    [facility] Office: [phone]

## 2021-08-20 NOTE — Progress Notes (Addendum)
Anesthesia Review:  PCP: [name]  Cardiologist : none  Chest x-ray : EKG : 07/17/21  Echo : Stress test: Cardiac Cath :  Activity level: can do a flight of stairs without difficutly  Sleep Study/ CPAP : none  Fasting Blood Sugar :      / Checks Blood Sugar -- times a day:   Blood Thinner/ Instructions /Last Dose: ASA / Instructions/ Last Dose :   Pt had back surgery on 07/30/21 with Dr Lorin Mercy.  Doing well.  Sees Dr Lorin Mercy again on 08/27/21.  DR Lorin Mercy is aware pt having  thyroid surgery with DR Harlow Asa  per pt and DR Harlow Asa is aware pt had recent back surgery.  Both DR Harlow Asa and DR Lorin Mercy are ok with her having thyroid surgery as long as she is comfortable on bed per pt.

## 2021-08-20 NOTE — Progress Notes (Signed)
Surgery orders requested via Epic inbox. °

## 2021-08-20 NOTE — H&P (View-Only) (Signed)
REFERRING PHYSICIAN:  Shamleffer, Carlyle Basques*   PROVIDER:  Nnamdi Dacus Charlotta Newton, MD     Subjective    Chief Complaint: New Consultation (Right thyroid nodule)   History of Present Illness:   Patient is referred by Dr. Vivia Ewing for surgical evaluation and recommendations regarding a dominant right thyroid nodule.  Patient had undergone a CT scan of the chest for an unrelated issue.  Incidental finding was made of a right sided thyroid nodule.  Patient subsequently underwent a thyroid ultrasound in March 2023.  This demonstrated a normal-sized thyroid gland with a dominant nodule in the right lobe measuring 3.9 cm in greatest dimension.  This was felt to be mildly suspicious and fine-needle aspiration biopsy was recommended.  Biopsy was performed on April 01, 2021.  This returned as a benign follicular nodule, Bethesda category II.  Patient has normal thyroid function with a recent TSH level of 0.87.  Patient has never been on thyroid medication.  She has had no prior head or neck surgery.  There is a history of medical thyroid disease in the patient's mother who takes thyroid medication.  There is no family history of thyroid cancer.  Patient has developed mild compressive symptoms.  She is concerned over the growth in size of the nodule in the right thyroid lobe.  She describes a globus sensation.  She is interested in proceeding with right thyroid lobectomy.     Review of Systems: A complete review of systems was obtained from the patient.  I have reviewed this information and discussed as appropriate with the patient.  See HPI as well for other ROS.   ROS      Medical History: Past Medical History      Past Medical History:  Diagnosis Date   Arthritis     Asthma, unspecified asthma severity, unspecified whether complicated, unspecified whether persistent     History of cancer     Hypertension             Patient Active Problem List  Diagnosis   Thyroid nodule       Past Surgical History       Past Surgical History:  Procedure Laterality Date   REPLACEMENT TOTAL KNEE Left 2016   LIMB SPARING RESECTION HIP W/ SADDLE JOINT REPLACEMENT Left 2022   HYSTERECTOMY            Allergies      Allergies  Allergen Reactions   Cortisone Other (See Comments)        No current outpatient medications on file prior to visit.    No current facility-administered medications on file prior to visit.      Family History       Family History  Problem Relation Age of Onset   Skin cancer Mother     High blood pressure (Hypertension) Mother     Hyperlipidemia (Elevated cholesterol) Mother     Skin cancer Brother     High blood pressure (Hypertension) Brother     Hyperlipidemia (Elevated cholesterol) Brother          Social History       Tobacco Use  Smoking Status Never  Smokeless Tobacco Never      Social History  Social History         Socioeconomic History   Marital status: Legally Separated  Tobacco Use   Smoking status: Never   Smokeless tobacco: Never  Substance and Sexual Activity   Alcohol use: Yes  Alcohol/week: 4.0 standard drinks      Types: 4 Glasses of wine per week   Drug use: Never        Objective:         Vitals:      BP: (!) 140/90  Pulse: 110  Temp: 36.8 C (98.3 F)  SpO2: 97%  Weight: 74.6 kg (164 lb 6.4 oz)  Height: 152.4 cm (5')    Body mass index is 32.11 kg/m.   Physical Exam    GENERAL APPEARANCE Comfortable, no acute issues Development: normal Gross deformities: none   SKIN Rash, lesions, ulcers: none Induration, erythema: none Nodules: none palpable   EYES Conjunctiva and lids: normal Pupils: equal and reactive   EARS, NOSE, MOUTH, THROAT External ears: no lesion or deformity External nose: no lesion or deformity Hearing: grossly normal   NECK Symmetric: no Trachea: midline Thyroid: On palpation there is a dominant nodule in the mid right thyroid lobe.  This is smooth,  discrete, mobile with swallowing, and nontender.  Patient on the left reveals no significant nodularity.  There is no associated lymphadenopathy.   CHEST Respiratory effort: normal Retraction or accessory muscle use: no Breath sounds: normal bilaterally Rales, rhonchi, wheeze: none   CARDIOVASCULAR Auscultation: regular rhythm, normal rate Murmurs: none Pulses: radial pulse 2+ palpable Lower extremity edema: none   ABDOMEN Not assessed   GENITOURINARY Not assessed   MUSCULOSKELETAL Station and gait: normal Digits and nails: no clubbing or cyanosis Muscle strength: grossly normal all extremities Range of motion: grossly normal all extremities Deformity: none   LYMPHATIC Cervical: none palpable Supraclavicular: none palpable   PSYCHIATRIC Oriented to person, place, and time: yes Mood and affect: normal for situation Judgment and insight: appropriate for situation       Assessment and Plan:  Diagnoses and all orders for this visit:   Thyroid nodule   Patient is referred by her endocrinologist for surgical evaluation and recommendations regarding a dominant left thyroid nodule.   Patient provided with a copy of "The Thyroid Book: Medical and Surgical Treatment of Thyroid Problems", published by [employer], 16 pages.  Book reviewed and explained to patient during visit today.   Today we reviewed the clinical history, the ultrasound, and the fine-needle aspiration biopsy cytopathology report.  Patient does have a benign right sided thyroid nodule measuring 3.9 cm in greatest dimension.  She has developed mild compressive symptoms and describes a globus sensation.  She is interested in proceeding with surgical resection.  There is a small 9 mm spongiform nodule on the left side on ultrasound.   After discussion, the patient would like to proceed with right thyroid lobectomy.  We discussed the procedure.  We discussed the risk and benefits including the risk of recurrent  laryngeal nerve injury and injury to parathyroid glands.  We discussed the size and location of the surgical incision.  We discussed the hospital stay to be anticipated.  We discussed the potential need for thyroid hormone supplementation.  Patient understands and wishes to proceed with surgery in the near future.   The risks and benefits of the procedure have been discussed at length with the patient.  The patient understands the proposed procedure, potential alternative treatments, and the course of recovery to be expected.  All of the patient's questions have been answered at this time.  The patient wishes to proceed with surgery.    [place] [facility]: [phone]

## 2021-08-21 NOTE — Patient Instructions (Signed)
SURGICAL WAITING ROOM VISITATION Patients having surgery or a procedure may have no more than 2 support people in the waiting area - these visitors may rotate.   Children under the age of 67 must have an adult with them who is not the patient. If the patient needs to stay at the hospital during part of their recovery, the visitor guidelines for inpatient rooms apply. Pre-op nurse will coordinate an appropriate time for 1 support person to accompany patient in pre-op.  This support person may not rotate.    Please refer to the [facility] website for the visitor guidelines for Inpatients (after your surgery is over and you are in a regular room).       Your procedure is scheduled on:  08/28/21    Report to [facility] Main Entrance    Report to admitting at  [place] AM   Call this number if you have problems the morning of surgery [phone]   Do not eat food :After Midnight.   After Midnight you may have the following liquids until ___ 0530___  am DAY OF SURGERY  Water Non-Citrus Juices (without pulp, NO RED) Carbonated Beverages Black Coffee (NO MILK/CREAM OR CREAMERS, sugar ok)  Clear Tea (NO MILK/CREAM OR CREAMERS, sugar ok) regular and decaf                             Plain Jell-O (NO RED)                                           Fruit ices (not with fruit pulp, NO RED)                                     Popsicles (NO RED)                                                               Sports drinks like Gatorade (NO RED)                    The day of surgery:  Drink ONE (1) Pre-Surgery Clear Ensure or G2 at  0530AM 9 have completed by )  the morning of surgery. Drink in one sitting. Do not sip.  This drink was given to you during your hospital  pre-op appointment visit. Nothing else to drink after completing the  Pre-Surgery Clear Ensure or G2.           Oral Hygiene is also important to reduce your risk of infection.                                     Remember - BRUSH YOUR TEETH THE MORNING OF SURGERY WITH YOUR REGULAR TOOTHPASTE   Do NOT smoke after Midnight   Take these medicines the morning of surgery with A SIP OF WATER:  cardizem if needed, gabapentin, protonix   DO NOT TAKE ANY ORAL DIABETIC MEDICATIONS DAY OF YOUR SURGERY  Bring  CPAP mask and tubing day of surgery.                              You may not have any metal on your body including hair pins, jewelry, and body piercing             Do not wear make-up, lotions, powders, perfumes/cologne, or deodorant  Do not wear nail polish including gel and S&S, artificial/acrylic nails, or any other type of covering on natural nails including finger and toenails. If you have artificial nails, gel coating, etc. that needs to be removed by a nail salon please have this removed prior to surgery or surgery may need to be canceled/ delayed if the surgeon/ anesthesia feels like they are unable to be safely monitored.   Do not shave  48 hours prior to surgery.               Men may shave face and neck.   Do not bring valuables to the hospital. [place].   Contacts, dentures or bridgework may not be worn into surgery.   Bring small overnight bag day of surgery.   DO NOT [place]. PHARMACY WILL DISPENSE MEDICATIONS LISTED ON YOUR MEDICATION LIST TO YOU DURING YOUR ADMISSION [place]!    Patients discharged on the day of surgery will not be allowed to drive home.  Someone NEEDS to stay with you for the first 24 hours after anesthesia.   Special Instructions: Bring a copy of your healthcare power of attorney and living will documents         the day of surgery if you haven't scanned them before.              Please read over the following fact sheets you were given: IF YOU HAVE QUESTIONS ABOUT YOUR PRE-OP INSTRUCTIONS PLEASE CALL [phone]     [facility] - Preparing for Surgery Before  surgery, you can play an important role.  Because skin is not sterile, your skin needs to be as free of germs as possible.  You can reduce the number of germs on your skin by washing with CHG (chlorahexidine gluconate) soap before surgery.  CHG is an antiseptic cleaner which kills germs and bonds with the skin to continue killing germs even after washing. Please DO NOT use if you have an allergy to CHG or antibacterial soaps.  If your skin becomes reddened/irritated stop using the CHG and inform your nurse when you arrive at Short Stay. Do not shave (including legs and underarms) for at least 48 hours prior to the first CHG shower.  You may shave your face/neck. Please follow these instructions carefully:  1.  Shower with CHG Soap the night before surgery and the  morning of Surgery.  2.  If you choose to wash your hair, wash your hair first as usual with your  normal  shampoo.  3.  After you shampoo, rinse your hair and body thoroughly to remove the  shampoo.                           4.  Use CHG as you would any other liquid soap.  You can apply chg directly  to the skin and wash  Gently with a scrungie or clean washcloth.  5.  Apply the CHG Soap to your body ONLY FROM THE NECK DOWN.   Do not use on face/ open                           Wound or open sores. Avoid contact with eyes, ears mouth and genitals (private parts).                       Wash face,  Genitals (private parts) with your normal soap.             6.  Wash thoroughly, paying special attention to the area where your surgery  will be performed.  7.  Thoroughly rinse your body with warm water from the neck down.  8.  DO NOT shower/wash with your normal soap after using and rinsing off  the CHG Soap.                9.  Pat yourself dry with a clean towel.            10.  Wear clean pajamas.            11.  Place clean sheets on your bed the night of your first shower and do not  sleep with pets. Day of Surgery  : Do not apply any lotions/deodorants the morning of surgery.  Please wear clean clothes to the hospital/surgery center.  FAILURE TO FOLLOW THESE INSTRUCTIONS MAY RESULT IN THE CANCELLATION OF YOUR SURGERY PATIENT SIGNATURE_________________________________  NURSE SIGNATURE__________________________________  ________________________________________________________________________

## 2021-08-24 ENCOUNTER — Encounter (HOSPITAL_COMMUNITY): Payer: Self-pay | Admitting: Surgery

## 2021-08-25 ENCOUNTER — Other Ambulatory Visit: Payer: Self-pay

## 2021-08-25 ENCOUNTER — Encounter (HOSPITAL_COMMUNITY): Payer: Self-pay

## 2021-08-25 ENCOUNTER — Encounter (HOSPITAL_COMMUNITY)
Admission: RE | Admit: 2021-08-25 | Discharge: 2021-08-25 | Disposition: A | Discharge status: Home / Self Care | Payer: Medicare Other | Source: Ambulatory Visit | Attending: Surgery | Admitting: Surgery

## 2021-08-25 VITALS — BP 130/97 | HR 69 | Temp 98.0°F | Resp 16 | Ht 60.0 in | Wt 164.0 lb

## 2021-08-25 DIAGNOSIS — Z01818 Encounter for other preprocedural examination: Secondary | ICD-10-CM | POA: Diagnosis present

## 2021-08-25 LAB — CBC
HCT: 39.1 % (ref 36.0–46.0)
Hemoglobin: 12.3 g/dL (ref 12.0–15.0)
MCH: 30.4 pg (ref 26.0–34.0)
MCHC: 31.5 g/dL (ref 30.0–36.0)
MCV: 96.8 fL (ref 80.0–100.0)
Platelets: 306 10*3/uL (ref 150–400)
RBC: 4.04 MIL/uL (ref 3.87–5.11)
RDW: 13 % (ref 11.5–15.5)
WBC: 5.9 10*3/uL (ref 4.0–10.5)
nRBC: 0 % (ref 0.0–0.2)

## 2021-08-25 LAB — BASIC METABOLIC PANEL
Anion gap: 6 (ref 5–15)
BUN: 17 mg/dL (ref 6–20)
CO2: 22 mmol/L (ref 22–32)
Calcium: 9 mg/dL (ref 8.9–10.3)
Chloride: 110 mmol/L (ref 98–111)
Creatinine, Ser: 0.7 mg/dL (ref 0.44–1.00)
GFR, Estimated: >60 mL/min (ref >60)
Glucose, Bld: 92 mg/dL (ref 70–99)
Potassium: 3.5 mmol/L (ref 3.5–5.1)
Sodium: 138 mmol/L (ref 135–145)

## 2021-08-27 ENCOUNTER — Ambulatory Visit (INDEPENDENT_AMBULATORY_CARE_PROVIDER_SITE_OTHER): Payer: Medicare Other | Admitting: Orthopaedic Surgery

## 2021-08-27 ENCOUNTER — Encounter: Payer: Self-pay | Admitting: Orthopaedic Surgery

## 2021-08-27 VITALS — BP 108/75 | HR 82 | Ht 60.0 in | Wt 164.0 lb

## 2021-08-27 DIAGNOSIS — Z9889 Other specified postprocedural states: Secondary | ICD-10-CM

## 2021-08-27 NOTE — Anesthesia Preprocedure Evaluation (Signed)
Anesthesia Evaluation  Patient identified by MRN, date of birth, ID band Patient awake    Reviewed: Allergy & Precautions, NPO status , Patient's Chart, lab work & pertinent test results  History of Anesthesia Complications Negative for: history of anesthetic complications  Airway Mallampati: II  TM Distance: >3 FB Neck ROM: Full    Dental  (+) Dental Advisory Given, Teeth Intact   Pulmonary former smoker,    Pulmonary exam normal        Cardiovascular hypertension, Pt. on medications Normal cardiovascular exam+ dysrhythmias Supra Ventricular Tachycardia      Neuro/Psych negative neurological ROS  negative psych ROS   GI/Hepatic GERD  Medicated and Controlled,  Endo/Other   Obesity   Renal/GU negative Renal ROS     Musculoskeletal  (+) Arthritis , Fibromyalgia -  Abdominal   Peds  Hematology negative hematology ROS (+)   Anesthesia Other Findings   Reproductive/Obstetrics                            Anesthesia Physical Anesthesia Plan  ASA: 2  Anesthesia Plan: General   Post-op Pain Management: Tylenol PO (pre-op)* and Celebrex PO (pre-op)*   Induction: Intravenous  PONV Risk Score and Plan: 3 and Treatment may vary due to age or medical condition, Ondansetron, Dexamethasone and Midazolam  Airway Management Planned: Oral ETT  Additional Equipment: None  Intra-op Plan:   Post-operative Plan: Extubation in OR  Informed Consent: I have reviewed the patients History and Physical, chart, labs and discussed the procedure including the risks, benefits and alternatives for the proposed anesthesia with the patient or authorized representative who has indicated his/her understanding and acceptance.     Dental advisory given  Plan Discussed with: CRNA and Anesthesiologist  Anesthesia Plan Comments:        Anesthesia Quick Evaluation

## 2021-08-27 NOTE — Progress Notes (Signed)
   Post-Op Visit Note   Patient: Amber Maldonado           Date of Birth: 04-Nov-1960           MRN: [id number] Visit Date: 08/27/2021 PCP: [name], MD   Assessment & Plan: Follow-up L3-4 decompression removal intraspinal extradural facet cyst.  She has noticed significant improvement in her symptoms she is back doing some part-time work Psychologist, educational.  Incision looks good she is applying lotion she is happy the surgical result and I will release her from care and check her back again on as-needed basis.  Chief Complaint:  Chief Complaint  Patient presents with   Lower Back - Routine Post Op, Follow-up    07/30/2021 L3-4 decompression, removal of intraspinal, extradural facet cyst   Visit Diagnoses:  1. Status post lumbar spine surgery for decompression of spinal cord     Plan: return PRN  Follow-Up Instructions: No follow-ups on file.   Orders:  No orders of the defined types were placed in this encounter.  No orders of the defined types were placed in this encounter.   Imaging: No results found.  PMFS History: Patient Active Problem List   Diagnosis Date Noted   Status post lumbar spine surgery for decompression of spinal cord 08/06/2021   Low back pain 05/15/2021   Right thyroid nodule 02/07/2021   Fibromyalgia 02/07/2021   Vitamin B12 deficiency 02/07/2021   GERD (gastroesophageal reflux disease) 02/07/2021   COVID-19 virus infection 02/06/2021   Hypomagnesemia 02/06/2021   Hypokalemia 02/06/2021   Sepsis due to acute pyelonephritis 02/06/2021   Sepsis ([place]) 02/06/2021   Palpitations 04/12/2020   Aortic atherosclerosis ([place]) 04/12/2020   SVT (supraventricular tachycardia) ([place]) 02/01/2020   Essential hypertension 02/01/2020   Past Medical History:  Diagnosis Date   Arthritis    Cancer ([place])    right leg melanoma   Fibromyalgia    GERD (gastroesophageal reflux disease)    Hypertension    Irritable bowel disease    SVT (supraventricular  tachycardia) ([facility])    Thyroid nodule    04/02/21 right FNA: benign follicular nodule (Bethesda Category II)    No family history on file.  Past Surgical History:  Procedure Laterality Date   ABDOMINAL HYSTERECTOMY     HIP ARTHROSCOPY  2010   clean out arthiritis   KNEE ARTHROPLASTY     LUMBAR LAMINECTOMY/DECOMPRESSION MICRODISCECTOMY N/A 07/30/2021   Procedure: LUMBAR THREE-FOUR DECOMPRESSION, REMOVAL OF INTRASPINAL EXTRADURAL FACET CYST;  Surgeon: [name], MD;  Location: [place];  Service: Orthopedics;  Laterality: N/A;   Social History   Occupational History   Not on file  Tobacco Use   Smoking status: Former    Types: Cigarettes    Quit date: 1994    Years since quitting: 29.6   Smokeless tobacco: Never  Vaping Use   Vaping Use: Never used  Substance and Sexual Activity   Alcohol use: Yes    Comment: occassional   Drug use: Never   Sexual activity: Not Currently

## 2021-08-28 ENCOUNTER — Ambulatory Visit (HOSPITAL_BASED_OUTPATIENT_CLINIC_OR_DEPARTMENT_OTHER): Payer: Medicare Other | Admitting: Anesthesiology

## 2021-08-28 ENCOUNTER — Encounter (HOSPITAL_COMMUNITY)
Admission: RE | Disposition: A | Discharge status: Home / Self Care | Payer: Self-pay | Source: Home / Self Care | Attending: Surgery

## 2021-08-28 ENCOUNTER — Ambulatory Visit (HOSPITAL_COMMUNITY)
Admission: RE | Admit: 2021-08-28 | Discharge: 2021-08-29 | Disposition: A | Discharge status: Home / Self Care | Payer: Medicare Other | Attending: Surgery | Admitting: Surgery

## 2021-08-28 ENCOUNTER — Ambulatory Visit (HOSPITAL_COMMUNITY): Payer: Medicare Other | Admitting: Physician Assistant

## 2021-08-28 ENCOUNTER — Other Ambulatory Visit: Payer: Self-pay

## 2021-08-28 ENCOUNTER — Encounter (HOSPITAL_COMMUNITY): Payer: Self-pay | Admitting: Surgery

## 2021-08-28 DIAGNOSIS — E041 Nontoxic single thyroid nodule: Secondary | ICD-10-CM | POA: Diagnosis not present

## 2021-08-28 DIAGNOSIS — D34 Benign neoplasm of thyroid gland: Secondary | ICD-10-CM | POA: Insufficient documentation

## 2021-08-28 DIAGNOSIS — K219 Gastro-esophageal reflux disease without esophagitis: Secondary | ICD-10-CM | POA: Diagnosis not present

## 2021-08-28 DIAGNOSIS — M1990 Unspecified osteoarthritis, unspecified site: Secondary | ICD-10-CM | POA: Insufficient documentation

## 2021-08-28 DIAGNOSIS — Z87891 Personal history of nicotine dependence: Secondary | ICD-10-CM | POA: Insufficient documentation

## 2021-08-28 DIAGNOSIS — I10 Essential (primary) hypertension: Secondary | ICD-10-CM | POA: Insufficient documentation

## 2021-08-28 DIAGNOSIS — M797 Fibromyalgia: Secondary | ICD-10-CM | POA: Diagnosis not present

## 2021-08-28 DIAGNOSIS — Z01818 Encounter for other preprocedural examination: Secondary | ICD-10-CM

## 2021-08-28 HISTORY — PX: THYROID LOBECTOMY: SHX420

## 2021-08-28 SURGERY — LOBECTOMY, THYROID
Anesthesia: General | Laterality: Right

## 2021-08-28 MED ORDER — TRAMADOL HCL 50 MG PO TABS: 50.0000 mg | ORAL_TABLET | Freq: Four times a day (QID) | ORAL | 0 refills | Status: DC | PRN

## 2021-08-28 MED ORDER — CHLORHEXIDINE GLUCONATE CLOTH 2 % EX PADS: 6.0000 | MEDICATED_PAD | Freq: Once | CUTANEOUS | Status: DC

## 2021-08-28 MED ORDER — HEMOSTATIC AGENTS (NO CHARGE) OPTIME
TOPICAL | Status: DC | PRN
  Administered 2021-08-28: 1

## 2021-08-28 MED ORDER — ONDANSETRON HCL 4 MG/2ML IJ SOLN
INTRAMUSCULAR | Status: DC | PRN
  Administered 2021-08-28: 4 mg via INTRAVENOUS

## 2021-08-28 MED ORDER — MIDAZOLAM HCL 5 MG/5ML IJ SOLN
INTRAMUSCULAR | Status: DC | PRN
  Administered 2021-08-28: 2 mg via INTRAVENOUS

## 2021-08-28 MED ORDER — MELATONIN 5 MG PO TABS
10.0000 mg | ORAL_TABLET | Freq: Every day | ORAL | Status: DC
  Administered 2021-08-28: 10 mg via ORAL
  Filled 2021-08-28: qty 2

## 2021-08-28 MED ORDER — SODIUM CHLORIDE 0.45 % IV SOLN: INTRAVENOUS | Status: DC

## 2021-08-28 MED ORDER — ACETAMINOPHEN 325 MG PO TABS: 650.0000 mg | ORAL_TABLET | Freq: Four times a day (QID) | ORAL | Status: DC | PRN

## 2021-08-28 MED ORDER — CHLORHEXIDINE GLUCONATE 0.12 % MT SOLN
15.0000 mL | Freq: Once | OROMUCOSAL | Status: AC
  Administered 2021-08-28: 15 mL via OROMUCOSAL

## 2021-08-28 MED ORDER — ACETAMINOPHEN 500 MG PO TABS
ORAL_TABLET | ORAL | Status: AC
  Administered 2021-08-28: 1000 mg via ORAL
  Filled 2021-08-28: qty 2

## 2021-08-28 MED ORDER — DEXAMETHASONE SODIUM PHOSPHATE 10 MG/ML IJ SOLN
INTRAMUSCULAR | Status: DC | PRN
  Administered 2021-08-28: 10 mg via INTRAVENOUS

## 2021-08-28 MED ORDER — LIDOCAINE 2% (20 MG/ML) 5 ML SYRINGE
INTRAMUSCULAR | Status: DC | PRN
  Administered 2021-08-28: 80 mg via INTRAVENOUS

## 2021-08-28 MED ORDER — ONDANSETRON 4 MG PO TBDP: 4.0000 mg | ORAL_TABLET | Freq: Four times a day (QID) | ORAL | Status: DC | PRN

## 2021-08-28 MED ORDER — ACETAMINOPHEN 500 MG PO TABS: 1000.0000 mg | ORAL_TABLET | Freq: Once | ORAL | Status: AC

## 2021-08-28 MED ORDER — GABAPENTIN 300 MG PO CAPS
300.0000 mg | ORAL_CAPSULE | Freq: Three times a day (TID) | ORAL | Status: DC
  Administered 2021-08-28 – 2021-08-29 (×3): 300 mg via ORAL
  Filled 2021-08-28 (×3): qty 1

## 2021-08-28 MED ORDER — OXYCODONE HCL 5 MG PO TABS
5.0000 mg | ORAL_TABLET | Freq: Once | ORAL | Status: AC | PRN
  Administered 2021-08-28: 5 mg via ORAL

## 2021-08-28 MED ORDER — PROPOFOL 10 MG/ML IV BOLUS
INTRAVENOUS | Status: DC | PRN
  Administered 2021-08-28: 150 mg via INTRAVENOUS

## 2021-08-28 MED ORDER — 0.9 % SODIUM CHLORIDE (POUR BTL) OPTIME
TOPICAL | Status: DC | PRN
  Administered 2021-08-28: 1000 mL

## 2021-08-28 MED ORDER — OXYCODONE HCL 5 MG/5ML PO SOLN: 5.0000 mg | Freq: Once | ORAL | Status: AC | PRN

## 2021-08-28 MED ORDER — ONDANSETRON HCL 4 MG/2ML IJ SOLN: 4.0000 mg | Freq: Four times a day (QID) | INTRAMUSCULAR | Status: DC | PRN

## 2021-08-28 MED ORDER — HYDROMORPHONE HCL 1 MG/ML IJ SOLN
0.2500 mg | INTRAMUSCULAR | Status: DC | PRN
  Administered 2021-08-28 (×3): 0.5 mg via INTRAVENOUS

## 2021-08-28 MED ORDER — TRAMADOL HCL 50 MG PO TABS: 50.0000 mg | ORAL_TABLET | Freq: Four times a day (QID) | ORAL | Status: DC | PRN

## 2021-08-28 MED ORDER — HYDROMORPHONE HCL 1 MG/ML IJ SOLN
1.0000 mg | INTRAMUSCULAR | Status: DC | PRN
  Administered 2021-08-28: 1 mg via INTRAVENOUS
  Filled 2021-08-28: qty 1

## 2021-08-28 MED ORDER — SUGAMMADEX SODIUM 200 MG/2ML IV SOLN
INTRAVENOUS | Status: DC | PRN
  Administered 2021-08-28: 200 mg via INTRAVENOUS

## 2021-08-28 MED ORDER — PROMETHAZINE HCL 25 MG/ML IJ SOLN: 6.2500 mg | INTRAMUSCULAR | Status: DC | PRN

## 2021-08-28 MED ORDER — CELECOXIB 200 MG PO CAPS: 200.0000 mg | ORAL_CAPSULE | Freq: Once | ORAL | Status: AC

## 2021-08-28 MED ORDER — METHOCARBAMOL 500 MG PO TABS: 500.0000 mg | ORAL_TABLET | Freq: Four times a day (QID) | ORAL | Status: DC | PRN

## 2021-08-28 MED ORDER — LACTATED RINGERS IV SOLN: INTRAVENOUS | Status: DC

## 2021-08-28 MED ORDER — FENTANYL CITRATE PF 50 MCG/ML IJ SOSY
PREFILLED_SYRINGE | INTRAMUSCULAR | Status: AC
  Filled 2021-08-28: qty 1

## 2021-08-28 MED ORDER — ACETAMINOPHEN 650 MG RE SUPP: 650.0000 mg | Freq: Four times a day (QID) | RECTAL | Status: DC | PRN

## 2021-08-28 MED ORDER — HYDROMORPHONE HCL 1 MG/ML IJ SOLN
INTRAMUSCULAR | Status: AC
  Administered 2021-08-28: 0.5 mg via INTRAVENOUS
  Filled 2021-08-28: qty 2

## 2021-08-28 MED ORDER — CEFAZOLIN SODIUM-DEXTROSE 2-4 GM/100ML-% IV SOLN
INTRAVENOUS | Status: AC
  Filled 2021-08-28: qty 100

## 2021-08-28 MED ORDER — OXYCODONE HCL 5 MG PO TABS
ORAL_TABLET | ORAL | Status: AC
  Filled 2021-08-28: qty 2

## 2021-08-28 MED ORDER — CELECOXIB 200 MG PO CAPS
ORAL_CAPSULE | ORAL | Status: AC
  Administered 2021-08-28: 200 mg via ORAL
  Filled 2021-08-28: qty 1

## 2021-08-28 MED ORDER — FENTANYL CITRATE PF 50 MCG/ML IJ SOSY
25.0000 ug | PREFILLED_SYRINGE | INTRAMUSCULAR | Status: DC | PRN
  Administered 2021-08-28 (×3): 50 ug via INTRAVENOUS

## 2021-08-28 MED ORDER — OXYCODONE HCL 5 MG PO TABS
5.0000 mg | ORAL_TABLET | ORAL | Status: DC | PRN
  Administered 2021-08-28 – 2021-08-29 (×6): 10 mg via ORAL
  Filled 2021-08-28 (×5): qty 2

## 2021-08-28 MED ORDER — CEFAZOLIN SODIUM-DEXTROSE 2-4 GM/100ML-% IV SOLN
2.0000 g | INTRAVENOUS | Status: AC
  Administered 2021-08-28: 2 g via INTRAVENOUS

## 2021-08-28 MED ORDER — PANTOPRAZOLE SODIUM 40 MG PO TBEC
40.0000 mg | DELAYED_RELEASE_TABLET | Freq: Every day | ORAL | Status: DC
  Administered 2021-08-29: 40 mg via ORAL
  Filled 2021-08-28: qty 1

## 2021-08-28 MED ORDER — FENTANYL CITRATE (PF) 100 MCG/2ML IJ SOLN
INTRAMUSCULAR | Status: DC | PRN
Start: 2021-08-28 — End: 2021-08-28
  Administered 2021-08-28: 100 ug via INTRAVENOUS
  Administered 2021-08-28: 50 ug via INTRAVENOUS

## 2021-08-28 MED ORDER — ORAL CARE MOUTH RINSE: 15.0000 mL | Freq: Once | OROMUCOSAL | Status: AC

## 2021-08-28 MED ORDER — LISINOPRIL 5 MG PO TABS
5.0000 mg | ORAL_TABLET | Freq: Every day | ORAL | Status: DC
  Administered 2021-08-28 – 2021-08-29 (×2): 5 mg via ORAL
  Filled 2021-08-28 (×2): qty 1

## 2021-08-28 MED ORDER — ROCURONIUM BROMIDE 10 MG/ML (PF) SYRINGE
PREFILLED_SYRINGE | INTRAVENOUS | Status: DC | PRN
  Administered 2021-08-28: 50 mg via INTRAVENOUS

## 2021-08-28 SURGICAL SUPPLY — 31 items
ATTRACTOMAT 16X20 MAGNETIC DRP (DRAPES) ×1 IMPLANT
BAG COUNTER SPONGE SURGICOUNT (BAG) ×1 IMPLANT
BLADE SURG 15 STRL LF DISP TIS (BLADE) ×1 IMPLANT
BLADE SURG 15 STRL SS (BLADE) ×1
CHLORAPREP W/TINT 26 (MISCELLANEOUS) ×1 IMPLANT
CLIP TI MEDIUM 6 (CLIP) ×2 IMPLANT
CLIP TI WIDE RED SMALL 6 (CLIP) ×2 IMPLANT
COVER SURGICAL LIGHT HANDLE (MISCELLANEOUS) ×1 IMPLANT
DERMABOND ADVANCED (GAUZE/BANDAGES/DRESSINGS) ×1
DERMABOND ADVANCED .7 DNX12 (GAUZE/BANDAGES/DRESSINGS) ×1 IMPLANT
DRAPE LAPAROTOMY T 98X78 PEDS (DRAPES) ×1 IMPLANT
DRAPE UTILITY XL STRL (DRAPES) ×1 IMPLANT
ELECT PENCIL ROCKER SW 15FT (MISCELLANEOUS) ×1 IMPLANT
ELECT REM PT RETURN 15FT ADLT (MISCELLANEOUS) ×1 IMPLANT
GAUZE 4X4 16PLY ~~LOC~~+RFID DBL (SPONGE) ×1 IMPLANT
GLOVE SURG ORTHO 8.0 STRL STRW (GLOVE) ×1 IMPLANT
GOWN STRL REUS W/ TWL XL LVL3 (GOWN DISPOSABLE) ×2 IMPLANT
GOWN STRL REUS W/TWL XL LVL3 (GOWN DISPOSABLE) ×2
HEMOSTAT SURGICEL 2X4 FIBR (HEMOSTASIS) ×1 IMPLANT
ILLUMINATOR WAVEGUIDE N/F (MISCELLANEOUS) ×1 IMPLANT
KIT BASIN OR (CUSTOM PROCEDURE TRAY) ×1 IMPLANT
KIT TURNOVER KIT A (KITS) IMPLANT
PACK BASIC VI WITH GOWN DISP (CUSTOM PROCEDURE TRAY) ×1 IMPLANT
SHEARS HARMONIC 9CM CVD (BLADE) ×1 IMPLANT
SUT MNCRL AB 4-0 PS2 18 (SUTURE) ×1 IMPLANT
SUT SILK 3 0 12 30 (SUTURE) IMPLANT
SUT VIC AB 3-0 SH 18 (SUTURE) ×2 IMPLANT
SYR BULB IRRIG 60ML STRL (SYRINGE) ×1 IMPLANT
TOWEL OR 17X26 10 PK STRL BLUE (TOWEL DISPOSABLE) ×1 IMPLANT
TOWEL OR NON WOVEN STRL DISP B (DISPOSABLE) ×1 IMPLANT
TUBING CONNECTING 10 (TUBING) ×1 IMPLANT

## 2021-08-28 NOTE — Anesthesia Procedure Notes (Signed)
Procedure Name: Intubation Date/Time: 08/28/2021 8:28 AM  Performed by: Gean Maidens, CRNAPre-anesthesia Checklist: Patient identified, Emergency Drugs available, Suction available, Patient being monitored and Timeout performed Patient Re-evaluated:Patient Re-evaluated prior to induction Oxygen Delivery Method: Circle system utilized Preoxygenation: Pre-oxygenation with 100% oxygen Induction Type: IV induction Ventilation: Mask ventilation without difficulty Laryngoscope Size: Mac and 4 Grade View: Grade I Tube type: Oral Tube size: 7.0 mm Number of attempts: 1 Airway Equipment and Method: Stylet Placement Confirmation: ETT inserted through vocal cords under direct vision, positive ETCO2 and breath sounds checked- equal and bilateral Secured at: 21 cm Tube secured with: Tape Dental Injury: Teeth and Oropharynx as per pre-operative assessment

## 2021-08-28 NOTE — Anesthesia Postprocedure Evaluation (Signed)
Anesthesia Post Note  Patient: Amber Maldonado  Procedure(s) Performed: RIGHT THYROID LOBECTOMY (Right)     Patient location during evaluation: PACU Anesthesia Type: General Level of consciousness: awake and alert Pain management: pain level controlled Vital Signs Assessment: post-procedure vital signs reviewed and stable Respiratory status: spontaneous breathing, nonlabored ventilation and respiratory function stable Cardiovascular status: stable and blood pressure returned to baseline Anesthetic complications: no   No notable events documented.  Last Vitals:  Vitals:   08/28/21 1045 08/28/21 1115  BP: 133/83 134/83  Pulse: (!) 55 68  Resp: 13 13  Temp:    SpO2: 99% 99%                  Audry Pili

## 2021-08-28 NOTE — Transfer of Care (Signed)
Immediate Anesthesia Transfer of Care Note  Patient: Amber Maldonado  Procedure(s) Performed: RIGHT THYROID LOBECTOMY (Right)  Patient Location: PACU  Anesthesia Type:General  Level of Consciousness: awake, alert  and oriented  Airway & Oxygen Therapy: Patient Spontanous Breathing and Patient connected to face mask oxygen  Post-op Assessment: Report given to RN and Post -op Vital signs reviewed and stable  Post vital signs: Reviewed and stable  Last Vitals:  Vitals Value Taken Time  BP    Temp    Pulse    Resp    SpO2      Last Pain:  Vitals:   08/28/21 0715  TempSrc:   PainSc: 0-No pain      Patients Stated Pain Goal: 3 (79/43/27 6147)  Complications: No notable events documented.

## 2021-08-28 NOTE — Op Note (Signed)
Procedure Note  Pre-operative Diagnosis:  right thyroid nodule  Post-operative Diagnosis:  same  Surgeon:  Armandina Gemma, MD  Assistant:  Mohammed Kindle, RN   Procedure:  Right thyroid lobectomy and isthmusectomy  Anesthesia:  General  Estimated Blood Loss:  minimal  Drains: none         Specimen: thyroid lobe to pathology  Indications:  Patient is referred by Dr. Vivia Ewing for surgical evaluation and recommendations regarding a dominant right thyroid nodule.  Patient had undergone a CT scan of the chest for an unrelated issue.  Incidental finding was made of a right sided thyroid nodule.  Patient subsequently underwent a thyroid ultrasound in March 2023.  This demonstrated a normal-sized thyroid gland with a dominant nodule in the right lobe measuring 3.9 cm in greatest dimension.  This was felt to be mildly suspicious and fine-needle aspiration biopsy was recommended.  Biopsy was performed on April 01, 2021.  This returned as a benign follicular nodule, Bethesda category II.  Patient has normal thyroid function with a recent TSH level of 0.87.  Patient has never been on thyroid medication.  She has had no prior head or neck surgery.  There is a history of medical thyroid disease in the patient's mother who takes thyroid medication.  There is no family history of thyroid cancer.  Patient has developed mild compressive symptoms.  She is concerned over the growth in size of the nodule in the right thyroid lobe.  She describes a globus sensation.  She is interested in proceeding with right thyroid lobectomy.  Procedure Details: Procedure was done in OR #1 at the [facility]. The patient was brought to the operating room and placed in a supine position on the operating room table. Following administration of general anesthesia, the patient was positioned and then prepped and draped in the usual aseptic fashion. After ascertaining that an adequate level of anesthesia had been achieved, a  small Kocher incision was made with #15 blade. Dissection was carried through subcutaneous tissues and platysma. Hemostasis was achieved with the electrocautery. Skin flaps were elevated cephalad and caudad from the thyroid notch to the sternal notch. A self-retaining retractor was placed for exposure. Strap muscles were incised in the midline and dissection was begun on the right side. Strap muscles were reflected laterally. The right thyroid lobe was moderately enlarged with a dominant nodule in the mid to upper portion of the lobe. The lobe was gently mobilized with blunt dissection. Superior pole vessels were dissected out and divided individually between small and medium ligaclips with the harmonic scalpel. The thyroid lobe was rolled anteriorly. Branches of the inferior thyroid artery were divided between small ligaclips with the harmonic scalpel. Inferior venous tributaries were divided between ligaclips. The recurrent laryngeal nerve was identified and preserved along its course. The ligament of Gwenlyn Found was released with the electrocautery and the gland was mobilized onto the anterior trachea. Isthmus was mobilized across the midline. There was no significant pyramidal lobe present. The thyroid parenchyma was transected at the junction of the isthmus and contralateral thyroid lobe with the harmonic scalpel. The thyroid lobe and isthmus were submitted to pathology for review.  The neck was irrigated with warm saline. Fibrillar was placed throughout the operative field. Strap muscles were approximated in the midline with interrupted 3-0 Vicryl sutures. Platysma was closed with interrupted 3-0 Vicryl sutures. Skin was closed with a running 4-0 Monocryl subcuticular suture.  Wound was washed and dried and Dermabond was applied. The patient  was awakened from anesthesia and brought to the recovery room. The patient tolerated the procedure well.   Armandina Gemma, [place] [facility] Office: [phone]

## 2021-08-28 NOTE — Discharge Instructions (Signed)
[facility] - Dr. Patrich Heinze  THYROID & PARATHYROID SURGERY:  POST-OP INSTRUCTIONS  Always review the instruction sheet provided by the hospital nurse at discharge.  A prescription for pain medication may be sent to your pharmacy at the time of discharge.  Take your pain medication as prescribed.  If narcotic pain medicine is not needed, then you may take acetaminophen (Tylenol) or ibuprofen (Advil) as needed for pain or soreness.  Take your normal home medications as prescribed unless otherwise directed.  If you need a refill on your pain medication, please contact the office during regular business hours.  Prescriptions will not be processed by the office after 5:00PM or on weekends.  Start with a light diet upon arrival home, such as soup and crackers or toast.  Be sure to drink plenty of fluids.  Resume your normal diet the day after surgery.  Most patients will experience some swelling and bruising on the chest and neck area.  Ice packs will help for the first 48 hours after arriving home.  Swelling and bruising will take several days to resolve.   It is common to experience some constipation after surgery.  Increasing fluid intake and taking a stool softener (Colace) will usually help to prevent this problem.  A mild laxative (Milk of Magnesia or Miralax) should be taken according to package directions if there has been no bowel movement after 48 hours.  Dermabond glue covers your incision. This seals the wound and you may shower at any time. The Dermabond will remain in place for about a week.  You may gradually remove the glue when it loosens around the edges.  If you need to loosen the Dermabond for removal, apply a layer of Vaseline to the wound for 15 minutes and then remove with a Kleenex. Your sutures are under the skin and will not show - they will dissolve on their own.  You may resume light daily activities beginning the day after discharge (such as self-care,  walking, climbing stairs), gradually increasing activities as tolerated. You may have sexual intercourse when it is comfortable. Refrain from any heavy lifting or straining until approved by your doctor. You may drive when you no longer are taking prescription pain medication, you can comfortably wear a seatbelt, and you can safely maneuver your car and apply the brakes.  You will see your doctor in the office for a follow-up appointment approximately three weeks after your surgery.  Make sure that you call for this appointment within a day or two after you arrive home to insure a convenient appointment time. Please have any requested laboratory tests performed a few days prior to your office visit so that the results will be available at your follow up appointment.  WHEN TO CALL THE [facility] OFFICE: -- Fever greater than 101.5 -- Inability to urinate -- Nausea and/or vomiting - persistent -- Extreme swelling or bruising -- Continued bleeding from incision -- Increased pain, redness, or drainage from the incision -- Difficulty swallowing or breathing -- Muscle cramping or spasms -- Numbness or tingling in hands or around lips  The clinic staff is available to answer your questions during regular business hours.  Please don't hesitate to call and ask to speak to one of the nurses if you have concerns.  [facility] OFFICE: [phone] (24 hours)  Please sign up for MyChart accounts. This will allow you to communicate directly with my nurse or myself without having to call the office. It will also allow you   to view your test results. You will need to enroll in MyChart for my office ([facility]) and for the hospital ([facility]).  Linde Wilensky, MD [facility] A [facility] practice 

## 2021-08-28 NOTE — Interval H&P Note (Signed)
History and Physical Interval Note:  08/28/2021 7:53 AM  Amber Maldonado  has presented today for surgery, with the diagnosis of RIGHT THYROID NODULE.  The various methods of treatment have been discussed with the patient and family. After consideration of risks, benefits and other options for treatment, the patient has consented to    Procedure(s): RIGHT THYROID LOBECTOMY (Right) as a surgical intervention.    The patient's history has been reviewed, patient examined, no change in status, stable for surgery.  I have reviewed the patient's chart and labs.  Questions were answered to the patient's satisfaction.    Armandina Gemma, [facility] [place] practice Office: [place]

## 2021-08-29 ENCOUNTER — Encounter (HOSPITAL_COMMUNITY): Payer: Self-pay | Admitting: Surgery

## 2021-08-29 DIAGNOSIS — D34 Benign neoplasm of thyroid gland: Secondary | ICD-10-CM | POA: Diagnosis not present

## 2021-08-29 NOTE — Discharge Summary (Signed)
Patient ID: Golda Zavalza MRN: [id number] DOB/AGE: [birth year] 61 y.o.  Admit date: 07/30/2021 Discharge date: 08/02/2021  Admission Diagnoses:  Active Problems:   * No active hospital problems. *   Discharge Diagnoses:  Active Problems:   * No active hospital problems. *  status post Procedure(s): LUMBAR THREE-FOUR DECOMPRESSION, REMOVAL OF INTRASPINAL EXTRADURAL FACET CYST  Past Medical History:  Diagnosis Date   Arthritis    Cancer ([place])    right leg melanoma   Fibromyalgia    GERD (gastroesophageal reflux disease)    Hypertension    Irritable bowel disease    SVT (supraventricular tachycardia) ([facility])    Thyroid nodule    04/02/21 right FNA: benign follicular nodule (Bethesda Category II)    Surgeries: Procedure(s): LUMBAR THREE-FOUR DECOMPRESSION, REMOVAL OF INTRASPINAL EXTRADURAL FACET CYST on 07/30/2021   Consultants:   Discharged Condition: Improved  Hospital Course: Mayo Owczarzak is an 61 y.o. female who was admitted 07/30/2021 for operative treatment of Lumbar stenosis. Patient failed conservative treatments (please see the history and physical for the specifics) and had severe unremitting pain that affects sleep, daily activities and work/hobbies. After pre-op clearance, the patient was taken to the operating room on 07/30/2021 and underwent  Procedure(s): LUMBAR THREE-FOUR DECOMPRESSION, REMOVAL OF INTRASPINAL EXTRADURAL FACET CYST.    Patient was given perioperative antibiotics:  Anti-infectives (From admission, onward)    Start     Dose/Rate Route Frequency Ordered Stop   07/30/21 1130  ceFAZolin (ANCEF) IVPB 2g/100 mL premix        2 g 200 mL/hr over 30 Minutes Intravenous On call to O.R. 07/30/21 1102 07/30/21 1349        Patient was given sequential compression devices and early ambulation to prevent DVT.   Patient benefited maximally from hospital stay and there were no complications. At the time of discharge, the patient was urinating/moving their  bowels without difficulty, tolerating a regular diet, pain is controlled with oral pain medications and they have been cleared by PT/OT.   Recent vital signs: No data found.   Recent laboratory studies: No results for input(s): "WBC", "HGB", "HCT", "PLT", "NA", "K", "CL", "CO2", "BUN", "CREATININE", "GLUCOSE", "INR", "CALCIUM" in the last 72 hours.  Invalid input(s): "PT", "2"   Discharge Medications:   Allergies as of 08/02/2021       Reactions   Triamcinolone Other (See Comments)   Burning and redness starting at face and traveling down body INJECTION- no shortness of breath        Medication List     STOP taking these medications    acetaminophen 650 MG CR tablet Commonly known as: TYLENOL   celecoxib 200 MG capsule Commonly known as: CELEBREX   traMADol 50 MG tablet Commonly known as: ULTRAM       TAKE these medications    alendronate 70 MG tablet Commonly known as: FOSAMAX Take 70 mg by mouth every Friday. Take with a full glass of water on an empty stomach.   amoxicillin 500 MG capsule Commonly known as: AMOXIL Take 2,000 mg by mouth See admin instructions. Take 2,000 mg by mouth two hours before dental appointments   CALCIUM 600/VITAMIN D PO Take 1 tablet by mouth 2 (two) times daily.   cholecalciferol 25 MCG (1000 UNIT) tablet Commonly known as: VITAMIN D3 Take 3,000 Units by mouth daily.   cyanocobalamin 1000 MCG/ML injection Commonly known as: VITAMIN B12 Inject 1,000 mcg into the muscle every 30 (thirty) days.   cyanocobalamin 1000 MCG tablet  Commonly known as: VITAMIN B12 Take 1,000 mcg by mouth daily.   diltiazem 30 MG tablet Commonly known as: Cardizem Take 1 tablet (30 mg total) by mouth as needed (palpitations).   Docusate Sodium 100 MG capsule Take 100 mg by mouth 2 (two) times daily.   ESTROVEN PO Take 1 tablet by mouth daily.   gabapentin 300 MG capsule Commonly known as: NEURONTIN Take 300 mg by mouth 3 (three) times daily.    lisinopril 5 MG tablet Commonly known as: ZESTRIL Take 5 mg by mouth daily.   Magnesium 400 MG Tabs Take 400 mg by mouth in the morning and at bedtime.   Melatonin 10 MG Tabs Take 10 mg by mouth at bedtime.   methocarbamol 500 MG tablet Commonly known as: ROBAXIN Take 1 tablet (500 mg total) by mouth every 6 (six) hours as needed for muscle spasms.   multivitamin with minerals Tabs tablet Take 2 tablets by mouth daily.   pantoprazole 40 MG tablet Commonly known as: PROTONIX Take 40 mg by mouth daily before breakfast.   triamcinolone cream 0.1 % Commonly known as: KENALOG Apply 1 Application topically 2 (two) times daily as needed (rash).   vitamin C 1000 MG tablet Take 1,000 mg by mouth every evening.   Zinc 50 MG Tabs Take 50 mg by mouth daily.        Diagnostic Studies: No results found.  Discharge Instructions     Call MD / Call 911   Complete by: As directed    If you experience chest pain or shortness of breath, CALL 911 and be transported to the hospital emergency room.  If you develope a fever above 101 F, pus (white drainage) or increased drainage or redness at the wound, or calf pain, call your surgeon's office.   Constipation Prevention   Complete by: As directed    Drink plenty of fluids.  Prune juice may be helpful.  You may use a stool softener, such as Colace (over the counter) 100 mg twice a day.  Use MiraLax (over the counter) for constipation as needed.   Diet - low sodium heart healthy   Complete by: As directed    Discharge instructions   Complete by: As directed    No lifting greater than 10 lbs. Avoid bending, stooping and twisting. Walk in house for first week them may start to get out slowly increasing distance up to one half mile by 3 weeks post op. Keep incision dry for 3 days, may use tegaderm or similar water impervious dressing. May bath and change dressing if saturated beginning post op day #3, today. If any drainage contact our  office immediately.   Driving restrictions   Complete by: As directed    No driving for 2 weeks   Incentive spirometry RT   Complete by: As directed    Increase activity slowly as tolerated   Complete by: As directed    Lifting restrictions   Complete by: As directed    No lifting for 8 weeks   Post-operative opioid taper instructions:   Complete by: As directed    POST-OPERATIVE OPIOID TAPER INSTRUCTIONS: It is important to wean off of your opioid medication as soon as possible. If you do not need pain medication after your surgery it is ok to stop day one. Opioids include: Codeine, Hydrocodone(Norco, Vicodin), Oxycodone(Percocet, oxycontin) and hydromorphone amongst others.  Long term and even short term use of opiods can cause: Increased pain response Dependence Constipation Depression Respiratory  depression And more.  Withdrawal symptoms can include Flu like symptoms Nausea, vomiting And more Techniques to manage these symptoms Hydrate well Eat regular healthy meals Stay active Use relaxation techniques(deep breathing, meditating, yoga) Do Not substitute Alcohol to help with tapering If you have been on opioids for less than two weeks and do not have pain than it is ok to stop all together.  Plan to wean off of opioids This plan should start within one week post op of your joint replacement. Maintain the same interval or time between taking each dose and first decrease the dose.  Cut the total daily intake of opioids by one tablet each day Next start to increase the time between doses. The last dose that should be eliminated is the evening dose.           Follow-up Information     [name], MD. Schedule an appointment as soon as possible for a visit today.   Specialty: Orthopedic Surgery Why: NEED RETURN OFFICE VISIT WITH DR Lorin Mercy ONE WEEK POSTOP Contact information: [address] [place] [address] [phone]                 Discharge  Plan:  discharge to home  Disposition:     Signed: Cherylin Waguespack  08/29/2021, 2:03 PM

## 2021-08-29 NOTE — Plan of Care (Signed)
  Problem: Health Behavior/Discharge Planning: Goal: Ability to manage health-related needs will improve Outcome: Progressing   Problem: Activity: Goal: Risk for activity intolerance will decrease Outcome: Progressing   Problem: Nutrition: Goal: Adequate nutrition will be maintained Outcome: Progressing   Problem: Elimination: Goal: Will not experience complications related to urinary retention Outcome: Progressing

## 2021-08-29 NOTE — Progress Notes (Signed)
Transition of Care [facility]) Screening Note  Patient Details  Name: Amber Maldonado Date of Birth: 15-Feb-1960  [facility]) CM/SW Contact:    Sherie Don, LCSW Phone Number: 08/29/2021, 10:21 AM  Transition of Care Department [facility]) has reviewed patient and no TOC needs have been identified at this time. We will continue to monitor patient advancement through interdisciplinary progression rounds. If new patient transition needs arise, please place a TOC consult.

## 2021-08-29 NOTE — Discharge Summary (Signed)
Patient ID: Amber Maldonado [id number] May 28, 1960 61 y.o.  Admit date: 08/28/2021 Discharge date: 08/29/2021  Admitting Diagnosis: Thyroid nodule  Discharge Diagnosis Patient Active Problem List   Diagnosis Date Noted   Status post lumbar spine surgery for decompression of spinal cord 08/06/2021   Low back pain 05/15/2021   Right thyroid nodule 02/07/2021   Fibromyalgia 02/07/2021   Vitamin B12 deficiency 02/07/2021   GERD (gastroesophageal reflux disease) 02/07/2021   COVID-19 virus infection 02/06/2021   Hypomagnesemia 02/06/2021   Hypokalemia 02/06/2021   Sepsis due to acute pyelonephritis 02/06/2021   Sepsis ([place]) 02/06/2021   Palpitations 04/12/2020   Aortic atherosclerosis ([place]) 04/12/2020   SVT (supraventricular tachycardia) ([facility]) 02/01/2020   Essential hypertension 02/01/2020  S/p right thyroid lobectomy and isthmusectomy  Consultants none  Reason for Admission: Patient is referred by Dr. Vivia Ewing for surgical evaluation and recommendations regarding a dominant right thyroid nodule.  Patient had undergone a CT scan of the chest for an unrelated issue.  Incidental finding was made of a right sided thyroid nodule.  Patient subsequently underwent a thyroid ultrasound in March 2023.  This demonstrated a normal-sized thyroid gland with a dominant nodule in the right lobe measuring 3.9 cm in greatest dimension.  This was felt to be mildly suspicious and fine-needle aspiration biopsy was recommended.  Biopsy was performed on April 01, 2021.  This returned as a benign follicular nodule, Bethesda category II.  Patient has normal thyroid function with a recent TSH level of 0.87.  Patient has never been on thyroid medication.  She has had no prior head or neck surgery.  There is a history of medical thyroid disease in the patient's mother who takes thyroid medication.  There is no family history of thyroid cancer.  Patient has developed mild compressive symptoms.  She is  concerned over the growth in size of the nodule in the right thyroid lobe.  She describes a globus sensation.  She is interested in proceeding with right thyroid lobectomy.  Procedures Right thyroid lobectomy and isthmusectomy, Dr. Harlow Asa 8/24  Hospital Course:  The patient underwent the above procedure.  She did well with no post op issues on POD 1.  She was tolerating a regular diet with no dysphagia and no hoarseness.  She was stable for DC home.  Physical Exam: Gen: NAD Neck: supple, trachea midline, incision is c/d/I with dermabond.  No hematoma.  Minimal ecchymosis noted on right side.  Allergies as of 08/29/2021       Reactions   Triamcinolone Other (See Comments)   Burning and redness starting at face and traveling down body INJECTION- no shortness of breath        Medication List     TAKE these medications    acetaminophen 650 MG CR tablet Commonly known as: TYLENOL Take 1,300 mg by mouth every 8 (eight) hours as needed for pain.   alendronate 70 MG tablet Commonly known as: FOSAMAX Take 70 mg by mouth every Friday. Take with a full glass of water on an empty stomach.   amoxicillin 500 MG capsule Commonly known as: AMOXIL Take 2,000 mg by mouth See admin instructions. Take 2,000 mg by mouth two hours before dental appointments   CALCIUM 600/VITAMIN D PO Take 1 tablet by mouth 2 (two) times daily.   cholecalciferol 25 MCG (1000 UNIT) tablet Commonly known as: VITAMIN D3 Take 3,000 Units by mouth daily.   cyanocobalamin 1000 MCG/ML injection Commonly known as: VITAMIN B12 Inject 1,000 mcg  into the muscle every 30 (thirty) days.   cyanocobalamin 1000 MCG tablet Commonly known as: VITAMIN B12 Take 1,000 mcg by mouth daily.   diltiazem 30 MG tablet Commonly known as: Cardizem Take 1 tablet (30 mg total) by mouth as needed (palpitations).   Docusate Sodium 100 MG capsule Take 100 mg by mouth 2 (two) times daily.   ESTROVEN PO Take 1 tablet by mouth  daily.   gabapentin 300 MG capsule Commonly known as: NEURONTIN Take 300 mg by mouth 3 (three) times daily.   lisinopril 5 MG tablet Commonly known as: ZESTRIL Take 5 mg by mouth daily.   Magnesium 400 MG Tabs Take 400 mg by mouth in the morning and at bedtime.   Melatonin 10 MG Tabs Take 10 mg by mouth at bedtime.   methocarbamol 500 MG tablet Commonly known as: ROBAXIN Take 1 tablet (500 mg total) by mouth every 6 (six) hours as needed for muscle spasms.   multivitamin with minerals Tabs tablet Take 2 tablets by mouth daily.   oxyCODONE-acetaminophen 5-325 MG tablet Commonly known as: PERCOCET/ROXICET Take 1 tablet by mouth every 4 (four) hours as needed for severe pain.   pantoprazole 40 MG tablet Commonly known as: PROTONIX Take 40 mg by mouth daily before breakfast.   traMADol 50 MG tablet Commonly known as: ULTRAM Take 1-2 tablets (50-100 mg total) by mouth every 6 (six) hours as needed for moderate pain.   triamcinolone cream 0.1 % Commonly known as: KENALOG Apply 1 Application topically 2 (two) times daily as needed (rash).   vitamin C 1000 MG tablet Take 1,000 mg by mouth every evening.   Zinc 50 MG Tabs Take 50 mg by mouth daily.          Follow-up Information     Armandina Gemma, MD. Schedule an appointment as soon as possible for a visit in 3 week(s).   Specialty: General Surgery Why: For wound re-check Contact information: [place] [address] [phone]                 Signed: Saverio Danker, [facility] 08/29/2021, 11:20 AM Please see Amion for pager number during day hours 7:00am-4:30pm, 7-11:30am on Weekends

## 2021-09-01 LAB — SURGICAL PATHOLOGY

## 2021-09-02 NOTE — Progress Notes (Signed)
Good news!  Pathology benign as expected.  [place], MD [facility] A [place] practice Office: [phone]

## 2021-10-27 ENCOUNTER — Other Ambulatory Visit: Payer: Self-pay | Admitting: Physician Assistant

## 2021-10-28 ENCOUNTER — Ambulatory Visit (INDEPENDENT_AMBULATORY_CARE_PROVIDER_SITE_OTHER): Payer: Medicare Other | Admitting: Internal Medicine

## 2021-10-28 ENCOUNTER — Encounter: Payer: Self-pay | Admitting: Internal Medicine

## 2021-10-28 VITALS — BP 110/70 | HR 75 | Ht 60.0 in | Wt 164.0 lb

## 2021-10-28 DIAGNOSIS — E890 Postprocedural hypothyroidism: Secondary | ICD-10-CM | POA: Diagnosis not present

## 2021-10-28 NOTE — Progress Notes (Unsigned)
Name: Amber Maldonado  MRN/ DOB: [id number], [birth year]    Age/ Sex: 61 y.o., female    PCP: [name], MD   Reason for Endocrinology Evaluation: Thyroid nodule      Date of Initial Endocrinology Evaluation: 03/24/2021    HPI: Amber Maldonado is a 60 y.o. female with a past medical history of HTN. The patient presented for initial endocrinology clinic visit on 03/24/2021 for consultative assistance with her Thyroid nodule .    She was hospitalized in 01/2021 for pyelonephritis , she was noted on CT imaging to have a right 1.9  cm thyroid nodule.    Thyroid ultrasound revealed a 3.9 cm thyroid nodule meeting FNA criteria.  She is s/p benign FNA in March 2023 but given the large size and local neck symptoms patient opted for surgical intervention   Patient is s/p right thyroid lobectomy 08/24/2021 with benign pathology report  Denies Fh of thyroid disease  Has personal hx of melanoma    Mother with Sq. Cell carcinoma   She is a Psychologist, sport and exercise     SUBJECTIVE:    Today (10/28/21):  Amber Maldonado is here for a follow up on right thyroid lobectomy.   She also had back sx 07/28/2021 due to synovial cyst removal from the spine    Weight has been stable but has noted weight gain  Neck swelling has been decreasing  Denies hoarseness  Has IBS with occasional change in BM Denies palpitations  Denies perioral tingling  Denies muscle spasms She has been noted with decreased energy        HISTORY:  Past Medical History:  Past Medical History:  Diagnosis Date   Arthritis    Cancer ([facility])    right leg melanoma   Fibromyalgia    GERD (gastroesophageal reflux disease)    Hypertension    Irritable bowel disease    SVT (supraventricular tachycardia)    Thyroid nodule    04/02/21 right FNA: benign follicular nodule (Bethesda Category II)   Past Surgical History:  Past Surgical History:  Procedure Laterality Date   ABDOMINAL HYSTERECTOMY     HIP ARTHROSCOPY  2010    clean out arthiritis   KNEE ARTHROPLASTY     LUMBAR LAMINECTOMY/DECOMPRESSION MICRODISCECTOMY N/A 07/30/2021   Procedure: LUMBAR THREE-FOUR DECOMPRESSION, REMOVAL OF INTRASPINAL EXTRADURAL FACET CYST;  Surgeon: [name], MD;  Location: [facility];  Service: Orthopedics;  Laterality: N/A;   THYROID LOBECTOMY Right 08/28/2021   Procedure: RIGHT THYROID LOBECTOMY;  Surgeon: Armandina Gemma, MD;  Location: [facility];  Service: General;  Laterality: Right;    Social History:  reports that she quit smoking about 29 years ago. Her smoking use included cigarettes. She has never used smokeless tobacco. She reports current alcohol use. She reports that she does not use drugs. Family History: family history is not on file.   HOME MEDICATIONS: Allergies as of 10/28/2021       Reactions   Triamcinolone Other (See Comments)   Burning and redness starting at face and traveling down body INJECTION- no shortness of breath        Medication List        Accurate as of October 28, 2021  3:04 PM. If you have any questions, ask your nurse or doctor.          STOP taking these medications    amoxicillin 500 MG capsule Commonly known as: AMOXIL Stopped by: Dorita Sciara, MD   oxyCODONE-acetaminophen 5-325 MG tablet  Commonly known as: PERCOCET/ROXICET Stopped by: Dorita Sciara, MD   traMADol 50 MG tablet Commonly known as: ULTRAM Stopped by: Dorita Sciara, MD       TAKE these medications    acetaminophen 650 MG CR tablet Commonly known as: TYLENOL Take 1,300 mg by mouth every 8 (eight) hours as needed for pain.   alendronate 70 MG tablet Commonly known as: FOSAMAX Take 70 mg by mouth every Friday. Take with a full glass of water on an empty stomach.   CALCIUM 600/VITAMIN D PO Take 1 tablet by mouth 2 (two) times daily.   cholecalciferol 25 MCG (1000 UNIT) tablet Commonly known as: VITAMIN D3 Take 3,000 Units by mouth daily.   cyanocobalamin 1000 MCG/ML  injection Commonly known as: VITAMIN B12 Inject 1,000 mcg into the muscle every 30 (thirty) days.   cyanocobalamin 1000 MCG tablet Commonly known as: VITAMIN B12 Take 1,000 mcg by mouth daily.   diltiazem 30 MG tablet Commonly known as: CARDIZEM TAKE 1 TABLET BY MOUTH EVERY DAY AS NEEDED   Docusate Sodium 100 MG capsule Take 100 mg by mouth 2 (two) times daily.   ESTROVEN PO Take 1 tablet by mouth daily.   gabapentin 300 MG capsule Commonly known as: NEURONTIN Take 300 mg by mouth 3 (three) times daily.   lisinopril 5 MG tablet Commonly known as: ZESTRIL Take 5 mg by mouth daily.   Magnesium 400 MG Tabs Take 400 mg by mouth in the morning and at bedtime.   Melatonin 10 MG Tabs Take 10 mg by mouth at bedtime.   methocarbamol 500 MG tablet Commonly known as: ROBAXIN Take 1 tablet (500 mg total) by mouth every 6 (six) hours as needed for muscle spasms.   multivitamin with minerals Tabs tablet Take 2 tablets by mouth daily.   pantoprazole 40 MG tablet Commonly known as: PROTONIX Take 40 mg by mouth daily before breakfast.   triamcinolone cream 0.1 % Commonly known as: KENALOG Apply 1 Application topically 2 (two) times daily as needed (rash).   vitamin C 1000 MG tablet Take 1,000 mg by mouth every evening.   Zinc 50 MG Tabs Take 50 mg by mouth daily.          REVIEW OF SYSTEMS: A comprehensive ROS was conducted with the patient and is negative except as per HPI     OBJECTIVE:  VS: BP 110/70 (BP Location: Left Arm, Patient Position: Sitting, Cuff Size: Large)   Pulse 75   Ht 5' (1.524 m)   Wt 164 lb (74.4 kg)   SpO2 98%   BMI 32.03 kg/m    Wt Readings from Last 3 Encounters:  10/28/21 164 lb (74.4 kg)  08/28/21 164 lb (74.4 kg)  08/27/21 164 lb (74.4 kg)     EXAM: General: Pt appears well and is in NAD  Eyes: External eye exam normal without stare, lid lag or exophthalmos.  EOM intact.  PERRL.  Neck: General: Supple without  adenopathy. Thyroid: surgical incision clean   Lungs: Clear with good BS bilat with no rales, rhonchi, or wheezes  Heart: Auscultation: RRR.  Abdomen: Normoactive bowel sounds, soft, nontender, without masses or organomegaly palpable  Extremities:  BL LE: No pretibial edema normal ROM and strength.  Mental Status: Judgment, insight: Intact Orientation: Oriented to time, place, and person Mood and affect: No depression, anxiety, or agitation     DATA REVIEWED:    Latest Reference Range & Units 10/28/21 15:11  Calcium 8.4 - 10.5  mg/dL 9.2  Albumin 3.5 - 5.2 g/dL 4.2     Latest Reference Range & Units 10/28/21 15:11  TSH 0.35 - 5.50 uIU/mL 1.90  T4,Free(Direct) 0.60 - 1.60 ng/dL 0.65      Pathology 08/28/2021  A. THYROID, RIGHT, LOBECTOMY:  - Benign hyperplastic nodule (follicular adenoma), 2.9 cm  - No evidence of malignancy        Thyroid ultrasound 03/27/2021   Estimated total number of nodules >/= 1 cm: 1   Number of spongiform nodules >/=  2 cm not described below (TR1): 0   Number of mixed cystic and solid nodules >/= 1.5 cm not described below ([place]): 0   _________________________________________________________   Nodule # 1:   Location: Right; Mid   Maximum size: 3.9 cm; Other 2 dimensions: 2.0 x 2.5 cm   Composition: solid/almost completely solid (2)   Echogenicity: isoechoic (1)   Shape: not taller-than-wide (0)   Margins: ill-defined (0)   Echogenic foci: none (0)   ACR TI-RADS total points: 3.   ACR TI-RADS risk category: TR3 (3 points).   ACR TI-RADS recommendations:   **Given size (>/= 2.5 cm) and appearance, fine needle aspiration of this mildly suspicious nodule should be considered based on TI-RADS criteria.   _________________________________________________________   There is an additional left inferior thyroid spongiform type nodule measuring only 9 mm. This would not meet criteria for any biopsy or follow-up.   Normal  vascularity.  No regional adenopathy.   IMPRESSION: 3.9 cm right mid thyroid TR 3 nodule meets criteria for biopsy as above. This correlates with the chest CT finding.    ASSESSMENT/PLAN/RECOMMENDATIONS:   Right thyroid nodule:   -She is S/P right lobectomy 08/2021 due to 3.9 cm nodule with benign pathology  -No local neck symptoms -She is clinically euthyroid - TSH is normal as well as calcium  No further endocrinology follow up is needed at this time, pt will follow up with PCP   Signed electronically by: Mack Guise, MD  [facility]  [facility] [address] [place], [place] [address] Phone: [phone] FAX: [phone]   CC: [name], MD [place] [address] Phone: [phone] Fax: [phone]   Return to Endocrinology clinic as below: Future Appointments  Date Time Provider [place]  03/23/2022  8:10 AM Patton Swisher, Melanie Crazier, MD [facility] None

## 2021-10-29 LAB — TSH: TSH: 1.9 u[IU]/mL (ref 0.35–5.50)

## 2021-10-29 LAB — CALCIUM: Calcium: 9.2 mg/dL (ref 8.4–10.5)

## 2021-10-29 LAB — ALBUMIN: Albumin: 4.2 g/dL (ref 3.5–5.2)

## 2021-10-29 LAB — T4, FREE: Free T4: 0.65 ng/dL (ref 0.60–1.60)

## 2022-03-23 ENCOUNTER — Ambulatory Visit: Payer: Medicare Other | Admitting: Internal Medicine

## 2022-07-06 ENCOUNTER — Ambulatory Visit
Admission: RE | Admit: 2022-07-06 | Discharge: 2022-07-06 | Disposition: A | Discharge status: Home / Self Care | Payer: Medicare Other | Source: Ambulatory Visit | Attending: Urgent Care | Admitting: Urgent Care

## 2022-07-06 VITALS — BP 117/80 | HR 85 | Temp 98.3°F | Resp 18

## 2022-07-06 DIAGNOSIS — R300 Dysuria: Secondary | ICD-10-CM | POA: Insufficient documentation

## 2022-07-06 LAB — POCT URINALYSIS DIP (MANUAL ENTRY)
Bilirubin, UA: NEGATIVE
Glucose, UA: NEGATIVE mg/dL
Ketones, POC UA: NEGATIVE mg/dL
Nitrite, UA: NEGATIVE
Protein Ur, POC: 30 mg/dL — AB
Spec Grav, UA: >=1.03 — AB (ref 1.010–1.025)
Urobilinogen, UA: 0.2 E.U./dL
pH, UA: 6 (ref 5.0–8.0)

## 2022-07-06 MED ORDER — CEPHALEXIN 500 MG PO CAPS: 500.0000 mg | ORAL_CAPSULE | Freq: Four times a day (QID) | ORAL | 0 refills | Status: AC

## 2022-07-06 NOTE — Discharge Instructions (Addendum)
Follow up here or with your primary care provider if your symptoms are worsening or not improving.     

## 2022-07-06 NOTE — ED Triage Notes (Signed)
Patient presents to [facility] for urinary freq and abdominal pressure. No OTC meds. Drinking lots of fluids.

## 2022-07-06 NOTE — ED Provider Notes (Addendum)
Amber Maldonado    CSN: [id number] Arrival date & time: 07/06/22  1327      History   Chief Complaint Chief Complaint  Patient presents with   Urinary Frequency    Pain and Pressure upon urination. - Entered by patient    HPI Amber Maldonado is a 62 y.o. female.    Urinary Frequency    Presents to urgent care with complaint of pain and pressure with urination.  Symptoms 1 day.  Denies fever.  Denies abdominal pain.  Denies back pain.  Past Medical History:  Diagnosis Date   Arthritis    Cancer ([facility])    right leg melanoma   Fibromyalgia    GERD (gastroesophageal reflux disease)    Hypertension    Irritable bowel disease    SVT (supraventricular tachycardia)    Thyroid nodule    04/02/21 right FNA: benign follicular nodule (Bethesda Category II)    Patient Active Problem List   Diagnosis Date Noted   Status post lumbar spine surgery for decompression of spinal cord 08/06/2021   Low back pain 05/15/2021   Right thyroid nodule 02/07/2021   Fibromyalgia 02/07/2021   Vitamin B12 deficiency 02/07/2021   GERD (gastroesophageal reflux disease) 02/07/2021   COVID-19 virus infection 02/06/2021   Hypomagnesemia 02/06/2021   Hypokalemia 02/06/2021   Sepsis due to acute pyelonephritis 02/06/2021   Sepsis ([facility]) 02/06/2021   Palpitations 04/12/2020   Aortic atherosclerosis ([facility]) 04/12/2020   SVT (supraventricular tachycardia) 02/01/2020   Essential hypertension 02/01/2020    Past Surgical History:  Procedure Laterality Date   ABDOMINAL HYSTERECTOMY     HIP ARTHROSCOPY  2010   clean out arthiritis   KNEE ARTHROPLASTY     LUMBAR LAMINECTOMY/DECOMPRESSION MICRODISCECTOMY N/A 07/30/2021   Procedure: LUMBAR THREE-FOUR DECOMPRESSION, REMOVAL OF INTRASPINAL EXTRADURAL FACET CYST;  Surgeon: Eldred Manges, MD;  Location: [facility];  Service: Orthopedics;  Laterality: N/A;   THYROID LOBECTOMY Right 08/28/2021   Procedure: RIGHT THYROID LOBECTOMY;  Surgeon: Darnell Level, MD;   Location: [facility];  Service: General;  Laterality: Right;    OB History   No obstetric history on file.      Home Medications    Prior to Admission medications   Medication Sig Start Date End Date Taking? Authorizing Provider  acetaminophen (TYLENOL) 650 MG CR tablet Take 1,300 mg by mouth every 8 (eight) hours as needed for pain.    [provider]  alendronate (FOSAMAX) 70 MG tablet Take 70 mg by mouth every Friday. Take with a full glass of water on an empty stomach.    [provider]  Ascorbic Acid (VITAMIN C) 1000 MG tablet Take 1,000 mg by mouth every evening.    [provider]  Calcium Carb-Cholecalciferol (CALCIUM 600/VITAMIN D PO) Take 1 tablet by mouth 2 (two) times daily.    [provider]  cholecalciferol (VITAMIN D) 25 MCG (1000 UNIT) tablet Take 3,000 Units by mouth daily.    [provider]  cyanocobalamin (,VITAMIN B-12,) 1000 MCG/ML injection Inject 1,000 mcg into the muscle every 30 (thirty) days.    [provider]  diltiazem (CARDIZEM) 30 MG tablet TAKE 1 TABLET BY MOUTH EVERY DAY AS NEEDED 10/27/21   Christell Constant, MD  Docusate Sodium 100 MG capsule Take 100 mg by mouth 2 (two) times daily.    [provider]  gabapentin (NEURONTIN) 300 MG capsule Take 300 mg by mouth 3 (three) times daily.    [provider]  lisinopril (ZESTRIL) 5 MG tablet Take 5 mg by mouth daily. 12/15/19   [provider]  Magnesium 400 MG TABS Take 400 mg by mouth in the morning and at bedtime.    [provider]  Melatonin 10 MG TABS Take 10 mg by mouth at bedtime.    [provider]  methocarbamol (ROBAXIN) 500 MG tablet Take 1 tablet (500 mg total) by mouth every 6 (six) hours as needed for muscle spasms. 07/30/21   Naida Sleight, PA-C  Multiple Vitamin (MULTIVITAMIN WITH MINERALS) TABS tablet Take 2 tablets by mouth daily.    [provider]  Nutritional Supplements  (ESTROVEN PO) Take 1 tablet by mouth daily.    [provider]  pantoprazole (PROTONIX) 40 MG tablet Take 40 mg by mouth daily before breakfast. 09/19/19   [provider]  triamcinolone cream (KENALOG) 0.1 % Apply 1 Application topically 2 (two) times daily as needed (rash).    [provider]  vitamin B-12 (CYANOCOBALAMIN) 1000 MCG tablet Take 1,000 mcg by mouth daily.    [provider]  Zinc 50 MG TABS Take 50 mg by mouth daily.    [provider]    Family History No family history on file.  Social History Social History   Tobacco Use   Smoking status: Former    Types: Cigarettes    Quit date: 1994    Years since quitting: 30.5   Smokeless tobacco: Never  Vaping Use   Vaping Use: Never used  Substance Use Topics   Alcohol use: Yes    Comment: occassional   Drug use: Never     Allergies   Triamcinolone   Review of Systems Review of Systems  Genitourinary:  Positive for frequency.     Physical Exam Triage Vital Signs ED Triage Vitals  Enc Vitals Group     BP      Pulse      Resp      Temp      Temp src      SpO2      Weight      Height      Head Circumference      Peak Flow      Pain Score      Pain Loc      Pain Edu?      Excl. in GC?    No data found.  Updated Vital Signs There were no vitals taken for this visit.  Visual Acuity Right Eye Distance:   Left Eye Distance:   Bilateral Distance:    Right Eye Near:   Left Eye Near:    Bilateral Near:     Physical Exam Vitals reviewed.  Constitutional:      Appearance: Normal appearance.  Skin:    General: Skin is warm and dry.  Neurological:     General: No focal deficit present.     Mental Status: She is alert and oriented to person, place, and time.  Psychiatric:        Mood and Affect: Mood normal.        Behavior: Behavior normal.      UC Treatments / Results  Labs (all labs ordered are listed, but only abnormal results are  displayed) Labs Reviewed - No data to display  EKG   Radiology No results found.  Procedures Procedures (including critical care time)  Medications Ordered in UC Medications - No data to display  Initial Impression / Assessment and Plan /  UC Course  I have reviewed the triage vital signs and the nursing notes.  Pertinent labs & imaging results that were available during my care of the patient were reviewed by me and considered in my medical decision making (see chart for details).   Maire Rzepecki is a 62 y.o. female presenting with dysuria. Patient is afebrile without recent antipyretics, satting well on room air. Overall is well appearing though non-toxic, well hydrated, without respiratory distress.  Reviewed relevant chart history.   UA result is not strongly suggestive of dysuria, however will prescribe antibiotic therapy based on her clinical presentation.  Sending urine culture to confirm diagnosis and susceptibility.  Counseled patient on potential for adverse effects with medications prescribed/recommended today, ER and return-to-clinic precautions discussed, patient verbalized understanding and agreement with care plan.  Final Clinical Impressions(s) / UC Diagnoses   Final diagnoses:  None   Discharge Instructions   None    ED Prescriptions   None    PDMP not reviewed this encounter.   Charma Igo, FNP 07/06/22 1405    Charma Igo, FNP 07/06/22 1407

## 2022-07-08 LAB — URINE CULTURE: Culture: >=100000 — AB

## 2022-07-16 ENCOUNTER — Ambulatory Visit: Payer: Medicare Other

## 2022-07-17 ENCOUNTER — Ambulatory Visit: Payer: Medicare Other

## 2022-11-27 ENCOUNTER — Ambulatory Visit: Payer: Medicare Other | Attending: Internal Medicine | Admitting: Internal Medicine

## 2022-11-30 ENCOUNTER — Encounter: Payer: Self-pay | Admitting: Internal Medicine

## 2023-03-01 ENCOUNTER — Emergency Department: Payer: Medicare Other

## 2023-03-01 ENCOUNTER — Emergency Department
Admission: EM | Admit: 2023-03-01 | Discharge: 2023-03-01 | Disposition: A | Discharge status: Home / Self Care | Payer: Medicare Other | Attending: Emergency Medicine | Admitting: Emergency Medicine

## 2023-03-01 ENCOUNTER — Other Ambulatory Visit: Payer: Self-pay

## 2023-03-01 ENCOUNTER — Encounter: Payer: Self-pay | Admitting: Emergency Medicine

## 2023-03-01 DIAGNOSIS — R509 Fever, unspecified: Secondary | ICD-10-CM | POA: Diagnosis present

## 2023-03-01 DIAGNOSIS — J101 Influenza due to other identified influenza virus with other respiratory manifestations: Secondary | ICD-10-CM | POA: Diagnosis not present

## 2023-03-01 DIAGNOSIS — R000 Tachycardia, unspecified: Secondary | ICD-10-CM | POA: Insufficient documentation

## 2023-03-01 DIAGNOSIS — N3000 Acute cystitis without hematuria: Secondary | ICD-10-CM | POA: Insufficient documentation

## 2023-03-01 LAB — CBC WITH DIFFERENTIAL/PLATELET
Abs Immature Granulocytes: 0.02 10*3/uL (ref 0.00–0.07)
Basophils Absolute: 0 10*3/uL (ref 0.0–0.1)
Basophils Relative: 1 %
Eosinophils Absolute: 0 10*3/uL (ref 0.0–0.5)
Eosinophils Relative: 0 %
HCT: 40.6 % (ref 36.0–46.0)
Hemoglobin: 13.7 g/dL (ref 12.0–15.0)
Immature Granulocytes: 0 %
Lymphocytes Relative: 17 %
Lymphs Abs: 1 10*3/uL (ref 0.7–4.0)
MCH: 29.8 pg (ref 26.0–34.0)
MCHC: 33.7 g/dL (ref 30.0–36.0)
MCV: 88.3 fL (ref 80.0–100.0)
Monocytes Absolute: 0.9 10*3/uL (ref 0.1–1.0)
Monocytes Relative: 15 %
Neutro Abs: 4 10*3/uL (ref 1.7–7.7)
Neutrophils Relative %: 67 %
Platelets: 213 10*3/uL (ref 150–400)
RBC: 4.6 MIL/uL (ref 3.87–5.11)
RDW: 14.6 % (ref 11.5–15.5)
WBC: 6 10*3/uL (ref 4.0–10.5)
nRBC: 0 % (ref 0.0–0.2)

## 2023-03-01 LAB — COMPREHENSIVE METABOLIC PANEL
ALT: 15 U/L (ref 0–44)
AST: 16 U/L (ref 15–41)
Albumin: 3.7 g/dL (ref 3.5–5.0)
Alkaline Phosphatase: 85 U/L (ref 38–126)
Anion gap: 13 (ref 5–15)
BUN: 11 mg/dL (ref 8–23)
CO2: 19 mmol/L — ABNORMAL LOW (ref 22–32)
Calcium: 8.6 mg/dL — ABNORMAL LOW (ref 8.9–10.3)
Chloride: 105 mmol/L (ref 98–111)
Creatinine, Ser: 0.55 mg/dL (ref 0.44–1.00)
GFR, Estimated: >60 mL/min (ref >60)
Glucose, Bld: 101 mg/dL — ABNORMAL HIGH (ref 70–99)
Potassium: 3.4 mmol/L — ABNORMAL LOW (ref 3.5–5.1)
Sodium: 137 mmol/L (ref 135–145)
Total Bilirubin: 0.3 mg/dL (ref 0.0–1.2)
Total Protein: 7.3 g/dL (ref 6.5–8.1)

## 2023-03-01 LAB — URINALYSIS, W/ REFLEX TO CULTURE (INFECTION SUSPECTED)
Bilirubin Urine: NEGATIVE
Glucose, UA: NEGATIVE mg/dL
Hgb urine dipstick: NEGATIVE
Ketones, ur: NEGATIVE mg/dL
Nitrite: NEGATIVE
Protein, ur: 30 mg/dL — AB
Specific Gravity, Urine: 1.021 (ref 1.005–1.030)
pH: 5 (ref 5.0–8.0)

## 2023-03-01 LAB — RESP PANEL BY RT-PCR (RSV, FLU A&B, COVID)  RVPGX2
Influenza A by PCR: POSITIVE — AB
Influenza B by PCR: NEGATIVE
Resp Syncytial Virus by PCR: NEGATIVE
SARS Coronavirus 2 by RT PCR: NEGATIVE

## 2023-03-01 LAB — LACTIC ACID, PLASMA: Lactic Acid, Venous: 1 mmol/L (ref 0.5–1.9)

## 2023-03-01 MED ORDER — HYDROCOD POLI-CHLORPHE POLI ER 10-8 MG/5ML PO SUER: 5.0000 mL | Freq: Two times a day (BID) | ORAL | 0 refills | Status: DC | PRN

## 2023-03-01 MED ORDER — CEFADROXIL 500 MG PO CAPS: 500.0000 mg | ORAL_CAPSULE | Freq: Two times a day (BID) | ORAL | 0 refills | Status: AC

## 2023-03-01 MED ORDER — LACTATED RINGERS IV BOLUS (SEPSIS)
1000.0000 mL | Freq: Once | INTRAVENOUS | Status: AC
  Administered 2023-03-01: 1000 mL via INTRAVENOUS

## 2023-03-01 MED ORDER — CEPHALEXIN 500 MG PO CAPS
500.0000 mg | ORAL_CAPSULE | Freq: Once | ORAL | Status: AC
  Administered 2023-03-01: 500 mg via ORAL
  Filled 2023-03-01: qty 1

## 2023-03-01 NOTE — Discharge Instructions (Addendum)
 You were diagnosed with the flu (influenza), as well as possibly a urinary tract infection for which we wrote you a course of antibiotics..  You will feel ill for as much as a few weeks.  Please take any prescribed medications as instructed, and you may use over-the-counter Tylenol and/or ibuprofen as needed according to label instructions (unless you have an allergy to either or have been told by your doctor not to take them).  Please make sure to drink plenty of fluids and refer to the included information about rehydration.  Follow up with your physician as instructed above, and return to the Emergency Department (ED) if you are unable to tolerate fluids due to vomiting, have worsening trouble breathing, become extremely tired or difficult to awaken, or if you develop any other symptoms that concern you.

## 2023-03-01 NOTE — ED Triage Notes (Signed)
 Pt from home via POV.  Pt reports fever, cough, congestion, body aches, SOB.  Measured 101.4 yesterday evening.  Pt has been alternating Tylenol and Ibuprofen Q4H for fever.

## 2023-03-01 NOTE — ED Provider Notes (Addendum)
 [facility] Provider Note    Event Date/Time   First MD Initiated Contact with Patient 03/01/23 [phone]     (approximate)   History   Fever   HPI Amber Maldonado is a 63 y.o. female who presents for evaluation of 4 to 5 days of flulike symptoms including myalgias, fever/chills, congestion, and mild cough.  She says she thought she was getting a little bit better but then seem to get worse again.  Multiple family members with whom she was in contact last week also tested positive for the flu.  She denies chest pain, nausea, vomiting, abdominal pain, and dysuria.  She states that about a year ago she had COVID but she also had a severe urinary tract infection but she does not think that she typically gets urinary tract infections.  Patient reports that she has been trying to stay hydrated with fluids but has not had much appetite and has not been eating very much.     Physical Exam   Triage Vital Signs: ED Triage Vitals  Encounter Vitals Group     BP 03/01/23 0258 (!) 121/90     Systolic BP Percentile --      Diastolic BP Percentile --      Pulse Rate 03/01/23 0258 (!) 119     Resp 03/01/23 0258 (!) 21     Temp 03/01/23 0258 99.6 F (37.6 C)     Temp Source 03/01/23 0258 Oral     SpO2 03/01/23 0257 97 %     Weight 03/01/23 0259 72.6 kg (160 lb)     Height 03/01/23 0259 1.524 m (5')     Head Circumference --      Peak Flow --      Pain Score 03/01/23 0259 6     Pain Loc --      Pain Education --      Exclude from Growth Chart --     Most recent vital signs: Vitals:   03/01/23 0335 03/01/23 0400  BP: (!) 132/91 112/71  Pulse: 88 84  Resp: 17   Temp:    SpO2: 100% 100%    General: Awake, appears uncomfortable from viral symptoms but otherwise is generally well-appearing. CV:  Good peripheral perfusion.  Mild tachycardia, regular rhythm. Resp:  Normal effort. Speaking easily and comfortably, no accessory muscle usage nor intercostal retractions.   Lungs clear to auscultation.  Occasional cough. Abd:  No distention.  No tenderness to palpation of the abdomen. Other:  Mood and affect are normal and appropriate.   ED Results / Procedures / Treatments   Labs (all labs ordered are listed, but only abnormal results are displayed) Labs Reviewed  RESP PANEL BY RT-PCR (RSV, FLU A&B, COVID)  RVPGX2 - Abnormal; Notable for the following components:      Result Value   Influenza A by PCR POSITIVE (*)    All other components within normal limits  COMPREHENSIVE METABOLIC PANEL - Abnormal; Notable for the following components:   Potassium 3.4 (*)    CO2 19 (*)    Glucose, Bld 101 (*)    Calcium 8.6 (*)    All other components within normal limits  URINALYSIS, W/ REFLEX TO CULTURE (INFECTION SUSPECTED) - Abnormal; Notable for the following components:   Color, Urine YELLOW (*)    APPearance HAZY (*)    Protein, ur 30 (*)    Leukocytes,Ua MODERATE (*)    Bacteria, UA RARE (*)    All other  components within normal limits  URINE CULTURE  LACTIC ACID, PLASMA  CBC WITH DIFFERENTIAL/PLATELET     RADIOLOGY I viewed and interpreted the patient's two-view chest x-ray and I see no evidence of pneumonia.  I also read the radiologist's report, which confirmed no acute findings.   PROCEDURES:  Critical Care performed: No  .1-3 Lead EKG Interpretation  Performed by: Loleta Rose, MD Authorized by: Loleta Rose, MD     Interpretation: abnormal     ECG rate:  115   ECG rate assessment: tachycardic     Rhythm: sinus tachycardia     Ectopy: none     Conduction: normal       IMPRESSION / MDM / ASSESSMENT AND PLAN / ED COURSE  I reviewed the triage vital signs and the nursing notes.                              Differential diagnosis includes, but is not limited to, respiratory viral illness such as influenza, community-acquired pneumonia, UTI, AKI.  Patient's presentation is most consistent with acute presentation with potential  threat to life or bodily function.  Labs/studies ordered: CBC with differential, CMP, urinalysis, urine culture, lactic acid, respiratory viral panel  Interventions/Medications given:  Medications  lactated ringers bolus 1,000 mL (0 mLs Intravenous Stopped 03/01/23 0440)  cephALEXin (KEFLEX) capsule 500 mg (500 mg Oral Given 03/01/23 0440)    (Note:  hospital course my include additional interventions and/or labs/studies not listed above.)   Patient appears ill from respiratory viral illness but is nontoxic in appearance and is awake, alert, and making jokes with me.  No respiratory distress despite occasional cough.  Chest x-ray clear, and labs are all essentially normal other than very minimal abnormalities on metabolic panel and testing influenza A positive.  Her urinalysis also shows moderate leukocytes and rare bacteria.  Given her history of complicated urinary tract infection, we talked about it and she would prefer to treat empirically which I think is a reasonable course of action.  I gave her a dose of cephalexin in the ED and a prescription for cefadroxil as well as some Tussionex.  She will follow-up with her primary care doctor.  I initially considered hospitalization given her tachycardia and the possibility of sepsis, but given the flu diagnosis I think this is very reasonable and appropriate and she and her daughter agree with the plan.  I gave my usual return precautions.  Of note, initial tachycardia around 115-120 resolved after IV fluids 1 L bolus.  Patient is not interested in Tamiflu and she is outside the 48-hour window regardless.  The patient was on the cardiac monitor to evaluate for evidence of arrhythmia and/or significant heart rate changes.       FINAL CLINICAL IMPRESSION(S) / ED DIAGNOSES   Final diagnoses:  Influenza A  Acute cystitis without hematuria     Rx / DC Orders   ED Discharge Orders          Ordered    cefadroxil (DURICEF) 500 MG capsule  2  times daily        03/01/23 0438    chlorpheniramine-HYDROcodone (TUSSIONEX) 10-8 MG/5ML  Every 12 hours PRN        03/01/23 0438             Note:  This document was prepared using Dragon voice recognition software and may include unintentional dictation errors.   Loleta Rose, MD 03/01/23  1610    Loleta Rose, MD 03/01/23 [phone]

## 2023-03-02 LAB — URINE CULTURE: Culture: NO GROWTH

## 2023-06-15 ENCOUNTER — Ambulatory Visit (INDEPENDENT_AMBULATORY_CARE_PROVIDER_SITE_OTHER): Admitting: Internal Medicine

## 2023-06-15 ENCOUNTER — Encounter: Payer: Self-pay | Admitting: Internal Medicine

## 2023-06-15 VITALS — BP 126/74 | HR 75 | Ht 60.0 in | Wt 161.0 lb

## 2023-06-15 DIAGNOSIS — R590 Localized enlarged lymph nodes: Secondary | ICD-10-CM | POA: Diagnosis not present

## 2023-06-15 DIAGNOSIS — E890 Postprocedural hypothyroidism: Secondary | ICD-10-CM

## 2023-06-15 LAB — T4, FREE: Free T4: 1 ng/dL (ref 0.8–1.8)

## 2023-06-15 LAB — TSH: TSH: 1.28 m[IU]/L (ref 0.40–4.50)

## 2023-06-15 NOTE — Progress Notes (Unsigned)
 Name: Amber Maldonado  MRN/ DOB: [id number], 1960-09-09    Age/ Sex: 63 y.o., female    PCP: Dania Dupre, MD   Reason for Endocrinology Evaluation: Thyroid  nodule      Date of Initial Endocrinology Evaluation: 03/24/2021    HPI: Ms. Amber Maldonado is a 63 y.o. female with a past medical history of HTN. The patient presented for initial endocrinology clinic visit on 03/24/2021 for consultative assistance with her Thyroid  nodule .    She was hospitalized in 01/2021 for pyelonephritis , she was noted on CT imaging to have a right 1.9  cm thyroid  nodule.    Thyroid  ultrasound revealed a 3.9 cm thyroid  nodule meeting FNA criteria.  She is s/p benign FNA in March 2023 but given the large size and local neck symptoms patient opted for surgical intervention   Patient is s/p right thyroid  lobectomy 08/24/2021 with benign pathology report   Denies Fh of thyroid  disease  Has personal hx of melanoma    Mother with Sq. Cell carcinoma   She is a Engineer, site     SUBJECTIVE:    Today (06/15/23):  Amber Maldonado is here for a follow up on right thyroid  lobectomy.    Two weeks ago noted right neck pain, constant in nature, burning sensation  Started Wegovy a few months ago, but stopped it after speaking to her daughter  No sore throat , no ear pain Has IBS with occasional change in BM Denies palpitations  Denies tremors     HISTORY:  Past Medical History:  Past Medical History:  Diagnosis Date   Arthritis    Cancer (HCC)    right leg melanoma   Fibromyalgia    GERD (gastroesophageal reflux disease)    Hypertension    Irritable bowel disease    SVT (supraventricular tachycardia) (HCC)    Thyroid  nodule    04/02/21 right FNA: benign follicular nodule ([facility] Category II)   Past Surgical History:  Past Surgical History:  Procedure Laterality Date   ABDOMINAL HYSTERECTOMY     HIP ARTHROSCOPY  2010   clean out arthiritis   KNEE ARTHROPLASTY     LUMBAR  LAMINECTOMY/DECOMPRESSION MICRODISCECTOMY N/A 07/30/2021   Procedure: LUMBAR THREE-FOUR DECOMPRESSION, REMOVAL OF INTRASPINAL EXTRADURAL FACET CYST;  Surgeon: Adah Acron, MD;  Location: [facility] OR;  Service: Orthopedics;  Laterality: N/A;   THYROID  LOBECTOMY Right 08/28/2021   Procedure: RIGHT THYROID  LOBECTOMY;  Surgeon: Oralee Billow, MD;  Location: [facility];  Service: General;  Laterality: Right;    Social History:  reports that she quit smoking about 31 years ago. Her smoking use included cigarettes. She has never used smokeless tobacco. She reports current alcohol use. She reports that she does not use drugs. Family History: family history is not on file.   HOME MEDICATIONS: Allergies as of 06/15/2023       Reactions   Triamcinolone Other (See Comments)   Burning and redness starting at face and traveling down body INJECTION- no shortness of breath   Hydrocortisone Other (See Comments)   Cortizone        Medication List        Accurate as of June 15, 2023  8:40 AM. If you have any questions, ask your nurse or doctor.          STOP taking these medications    chlorpheniramine-HYDROcodone  10-8 MG/5ML Commonly known as: TUSSIONEX Stopped by: Rosealie Reach J Kijana Estock   ESTROVEN PO Stopped by: Camilla Cedar Aide Wojnar   methocarbamol   500 MG tablet Commonly known as: ROBAXIN  Stopped by: Camilla Cedar Brance Dartt       TAKE these medications    acetaminophen  650 MG CR tablet Commonly known as: TYLENOL  Take 1,300 mg by mouth every 8 (eight) hours as needed for pain.   alendronate 70 MG tablet Commonly known as: FOSAMAX Take 70 mg by mouth every Friday. Take with a full glass of water on an empty stomach.   CALCIUM 600/VITAMIN D PO Take 1 tablet by mouth 2 (two) times daily.   cholecalciferol 25 MCG (1000 UNIT) tablet Commonly known as: VITAMIN D3 Take 3,000 Units by mouth daily.   cyanocobalamin  1000 MCG tablet Commonly known as: VITAMIN B12 Take 1,000 mcg by mouth  daily. What changed: Another medication with the same name was removed. Continue taking this medication, and follow the directions you see here. Changed by: Kaelee Pfeffer J Seairra Otani   diltiazem  30 MG tablet Commonly known as: CARDIZEM  TAKE 1 TABLET BY MOUTH EVERY DAY AS NEEDED   Docusate Sodium  100 MG capsule Take 100 mg by mouth 2 (two) times daily.   doxepin 10 MG/ML solution Commonly known as: SINEQUAN Take 50 mg by mouth at bedtime as needed.   fluorouracil 5 % cream Commonly known as: EFUDEX Apply topically 2 (two) times daily.   gabapentin  300 MG capsule Commonly known as: NEURONTIN  Take 300 mg by mouth 3 (three) times daily.   lisinopril  5 MG tablet Commonly known as: ZESTRIL  Take 5 mg by mouth daily.   Magnesium  400 MG Tabs Take 400 mg by mouth in the morning and at bedtime.   Melatonin 10 MG Tabs Take 10 mg by mouth at bedtime.   multivitamin with minerals Tabs tablet Take 2 tablets by mouth daily.   pantoprazole  40 MG tablet Commonly known as: PROTONIX  Take 40 mg by mouth daily before breakfast.   triamcinolone cream 0.1 % Commonly known as: KENALOG Apply 1 Application topically 2 (two) times daily as needed (rash).   vitamin C 1000 MG tablet Take 1,000 mg by mouth every evening.   Zinc 50 MG Tabs Take 50 mg by mouth daily.          REVIEW OF SYSTEMS: A comprehensive ROS was conducted with the patient and is negative except as per HPI     OBJECTIVE:  VS: BP 126/74 (BP Location: Left Arm, Patient Position: Sitting, Cuff Size: Normal)   Pulse 75   Ht 5' (1.524 m)   Wt 161 lb (73 kg)   SpO2 98%   BMI 31.44 kg/m    Wt Readings from Last 3 Encounters:  06/15/23 161 lb (73 kg)  03/01/23 160 lb (72.6 kg)  10/28/21 164 lb (74.4 kg)     EXAM: General: Pt appears well and is in NAD  Neck: General: Right cervical lymphadenopathy noted with tenderness Thyroid : No nodules appreciated  Lungs: Clear with good BS bilat   Heart: Auscultation: RRR.   Abdomen: soft, nontender  Extremities:  BL LE: No pretibial edema   Mental Status: Judgment, insight: Intact Orientation: Oriented to time, place, and person Mood and affect: No depression, anxiety, or agitation     DATA REVIEWED:  ***     Pathology 08/28/2021  A. 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  - No evidence of malignancy        Thyroid  ultrasound 03/27/2021   Estimated total number of nodules >/= 1 cm: 1   Number of spongiform nodules >/=  2 cm not described below (TR1): 0   Number of mixed cystic and solid nodules >/= 1.5 cm not described below (TR2): 0   _________________________________________________________   Nodule # 1:   Location: Right; Mid   Maximum size: 3.9 cm; Other 2 dimensions: 2.0 x 2.5 cm   Composition: solid/almost completely solid (2)   Echogenicity: isoechoic (1)   Shape: not taller-than-wide (0)   Margins: ill-defined (0)   Echogenic foci: none (0)   ACR TI-RADS total points: 3.   ACR TI-RADS risk category: TR3 (3 points).   ACR TI-RADS recommendations:   **Given size (>/= 2.5 cm) and appearance, fine needle aspiration of this mildly suspicious nodule should be considered based on TI-RADS criteria.   _________________________________________________________   There is an additional left inferior thyroid  spongiform type nodule measuring only 9 mm. This would not meet criteria for any biopsy or follow-up.   Normal vascularity.  No regional adenopathy.   IMPRESSION: 3.9 cm right mid thyroid  TR 3 nodule meets criteria for biopsy as above. This correlates with the chest CT finding.       ASSESSMENT/PLAN/RECOMMENDATIONS:   Right thyroid  nodule:   -She is S/P right lobectomy 08/2021 due to 3.9 cm nodule with benign pathology  - She is concerned of the right neck pain while being on Wegovy, I did assure the patient that her pathology report was benign and I doubt  that there will be anything sinister going in her neck, but we decided to proceed with thyroid  ultrasound -She is clinically euthyroid - TFT's ***  2. Right cervical lymphadenopathy:   - Patient takes Tylenol  twice daily and ibuprofen as needed, I did recommend NSAIDs - Reassurance provided at this time - Will proceed with thyroid  ultrasound  F/U PRN   Signed electronically by: Natale Bail, MD  [facility] Endocrinology  [facility] [address] [place] [place], [place] [address] Phone: [phone] FAX: [phone]   CC: Dania Dupre, MD [address] [place] [address] Phone: [phone] Fax: [phone]   Return to Endocrinology clinic as below: Future Appointments  Date Time Provider Department Center  06/15/2023  8:50 AM Chaz Ronning, Julian Obey, MD [facility] None

## 2023-06-17 ENCOUNTER — Emergency Department

## 2023-06-17 ENCOUNTER — Emergency Department
Admission: EM | Admit: 2023-06-17 | Discharge: 2023-06-17 | Disposition: A | Discharge status: Home / Self Care | Attending: Emergency Medicine | Admitting: Emergency Medicine

## 2023-06-17 ENCOUNTER — Other Ambulatory Visit: Payer: Self-pay

## 2023-06-17 ENCOUNTER — Ambulatory Visit: Payer: Self-pay | Admitting: Internal Medicine

## 2023-06-17 ENCOUNTER — Ambulatory Visit
Admission: RE | Admit: 2023-06-17 | Discharge: 2023-06-17 | Disposition: A | Discharge status: Home / Self Care | Source: Ambulatory Visit | Attending: Internal Medicine | Admitting: Internal Medicine

## 2023-06-17 DIAGNOSIS — I10 Essential (primary) hypertension: Secondary | ICD-10-CM | POA: Diagnosis not present

## 2023-06-17 DIAGNOSIS — M542 Cervicalgia: Secondary | ICD-10-CM | POA: Diagnosis present

## 2023-06-17 DIAGNOSIS — R590 Localized enlarged lymph nodes: Secondary | ICD-10-CM

## 2023-06-17 DIAGNOSIS — R600 Localized edema: Secondary | ICD-10-CM | POA: Diagnosis not present

## 2023-06-17 LAB — COMPREHENSIVE METABOLIC PANEL WITH GFR
ALT: 15 U/L (ref 0–44)
AST: 15 U/L (ref 15–41)
Albumin: 3.8 g/dL (ref 3.5–5.0)
Alkaline Phosphatase: 81 U/L (ref 38–126)
Anion gap: 6 (ref 5–15)
BUN: 22 mg/dL (ref 8–23)
CO2: 24 mmol/L (ref 22–32)
Calcium: 8.7 mg/dL — ABNORMAL LOW (ref 8.9–10.3)
Chloride: 111 mmol/L (ref 98–111)
Creatinine, Ser: 0.57 mg/dL (ref 0.44–1.00)
GFR, Estimated: >60 mL/min
Glucose, Bld: 70 mg/dL (ref 70–99)
Potassium: 3.3 mmol/L — ABNORMAL LOW (ref 3.5–5.1)
Sodium: 141 mmol/L (ref 135–145)
Total Bilirubin: 0.5 mg/dL (ref 0.0–1.2)
Total Protein: 6.9 g/dL (ref 6.5–8.1)

## 2023-06-17 LAB — CBC
HCT: 40.7 % (ref 36.0–46.0)
Hemoglobin: 13.4 g/dL (ref 12.0–15.0)
MCH: 31.2 pg (ref 26.0–34.0)
MCHC: 32.9 g/dL (ref 30.0–36.0)
MCV: 94.7 fL (ref 80.0–100.0)
Platelets: 252 10*3/uL (ref 150–400)
RBC: 4.3 MIL/uL (ref 3.87–5.11)
RDW: 13.9 % (ref 11.5–15.5)
WBC: 7.7 10*3/uL (ref 4.0–10.5)
nRBC: 0 % (ref 0.0–0.2)

## 2023-06-17 MED ORDER — KETOROLAC TROMETHAMINE 30 MG/ML IJ SOLN
15.0000 mg | Freq: Once | INTRAMUSCULAR | Status: AC
  Administered 2023-06-17: 15 mg via INTRAVENOUS
  Filled 2023-06-17: qty 1

## 2023-06-17 MED ORDER — POTASSIUM CHLORIDE CRYS ER 20 MEQ PO TBCR
20.0000 meq | EXTENDED_RELEASE_TABLET | Freq: Once | ORAL | Status: AC
  Administered 2023-06-17: 20 meq via ORAL
  Filled 2023-06-17: qty 1

## 2023-06-17 MED ORDER — IOHEXOL 300 MG/ML  SOLN
75.0000 mL | Freq: Once | INTRAMUSCULAR | Status: AC | PRN
  Administered 2023-06-17: 75 mL via INTRAVENOUS

## 2023-06-17 NOTE — ED Provider Notes (Signed)
 [facility] Provider Note    Event Date/Time   First MD Initiated Contact with Patient 06/17/23 1909     (approximate)   History   Chief Complaint Neck Pain   HPI  Amber Maldonado is a 63 y.o. female with past medical history of hypertension, SVT, fibromyalgia who presents to the ED complaining of neck pain.  Patient reports that she has had 10 days of increasing pain to the right side of her neck, just under her jawline.  She has noticed some mild swelling to this area but denies any redness or wounds.  She has had some pain in her throat when swallowing but denies any pain or swelling around her teeth.  She had a strep test earlier today that was negative, also reports getting an outpatient ultrasound of her neck that was unremarkable.  She has been taking Tylenol  without significant relief.     Physical Exam   Triage Vital Signs: ED Triage Vitals [06/17/23 1833]  Encounter Vitals Group     BP (!) 143/101     Girls Systolic BP Percentile      Girls Diastolic BP Percentile      Boys Systolic BP Percentile      Boys Diastolic BP Percentile      Pulse Rate 90     Resp 20     Temp 98.1 F (36.7 C)     Temp Source Oral     SpO2 100 %     Weight 160 lb 15 oz (73 kg)     Height 5' (1.524 m)     Head Circumference      Peak Flow      Pain Score 7     Pain Loc      Pain Education      Exclude from Growth Chart     Most recent vital signs: Vitals:   06/17/23 1833  BP: (!) 143/101  Pulse: 90  Resp: 20  Temp: 98.1 F (36.7 C)  SpO2: 100%    Constitutional: Alert and oriented. Eyes: Conjunctivae are normal. Head: Atraumatic. Nose: No congestion/rhinnorhea. Mouth/Throat: Mucous membranes are moist.  Posterior oropharynx clear without erythema or edema.  Dentition intact. Neck: Tenderness to palpation to the right neck, just inferior to the angle of the mandible.  Mild associated edema noted with no overlying erythema or warmth. Cardiovascular:  Normal rate, regular rhythm. Grossly normal heart sounds.  2+ radial pulses bilaterally. Respiratory: Normal respiratory effort.  No retractions. Lungs CTAB. Gastrointestinal: Soft and nontender. No distention. Musculoskeletal: No lower extremity tenderness nor edema.  Neurologic:  Normal speech and language. No gross focal neurologic deficits are appreciated.    ED Results / Procedures / Treatments   Labs (all labs ordered are listed, but only abnormal results are displayed) Labs Reviewed  COMPREHENSIVE METABOLIC PANEL WITH GFR - Abnormal; Notable for the following components:      Result Value   Potassium 3.3 (*)    Calcium 8.7 (*)    All other components within normal limits  CBC    RADIOLOGY CT soft tissue neck reviewed and interpreted by me with no inflammatory changes or focal fluid collections.  PROCEDURES:  Critical Care performed: No  Procedures   MEDICATIONS ORDERED IN ED: Medications  ketorolac  (TORADOL ) 30 MG/ML injection 15 mg (15 mg Intravenous Given 06/17/23 1943)  iohexol  (OMNIPAQUE ) 300 MG/ML solution 75 mL (75 mLs Intravenous Contrast Given 06/17/23 1955)  potassium chloride  SA (KLOR-CON  M) CR tablet 20  mEq (20 mEq Oral Given 06/17/23 2118)     IMPRESSION / MDM / ASSESSMENT AND PLAN / ED COURSE  I reviewed the triage vital signs and the nursing notes.                              63 y.o. female with past medical history of hypertension, SVT, and fibromyalgia who presents to the ED complaining of 10 days of increasing right neck pain with mild swelling.  Patient's presentation is most consistent with acute presentation with potential threat to life or bodily function.  Differential diagnosis includes, but is not limited to, abscess, cellulitis, lymphadenitis, parotitis, sialoadenitis, pharyngitis.  Patient nontoxic-appearing and in no acute distress, vital signs are unremarkable.  She has some tenderness to palpation to the right side of her neck with  mild edema, will further assess with CT imaging.  Labs without significant anemia, leukocytosis, electrolyte abnormality, or AKI.  LFTs are also unremarkable.  Patient would like some medication for pain, but is not interested in any narcotic pain medication.  We will give dose of IV Toradol  and reassess following imaging.  CT imaging is unremarkable, patient's pain improved on reassessment.  With reassuring workup, patient appropriate for discharge home with outpatient follow-up, referral provided to ENT.  She was counseled to return to the ED for new or worsening symptoms.  Patient agrees with plan.      FINAL CLINICAL IMPRESSION(S) / ED DIAGNOSES   Final diagnoses:  Neck pain     Rx / DC Orders   ED Discharge Orders     None        Note:  This document was prepared using Dragon voice recognition software and may include unintentional dictation errors.   Amber Galea, MD 06/17/23 2154

## 2023-06-17 NOTE — ED Triage Notes (Addendum)
 Pt to ED via right sided neck pain x1 wk. Pt reports was seen by endocrinologist and they did an US  which was negative. Pt reports has a burning sensation in neck and painful to swallow. Pt seen at [facility] and tested negative for strep. Pt had thyroid  nodule removed on right side 2 years ago.    Pt denies CP, SOB or fevers.

## 2023-08-27 IMAGING — CT CT ANGIO CHEST
2 of 8 series · 16 of 46 positions shown · IV contrast (agent unspecified)
Comparison: CT abdomen pelvis 12/31/2017

CLINICAL DATA: Flank pain, kidney stone suspected Nausea/vomiting;
Pulmonary embolism (PE) suspected, high prob



[Series 6: thins (person_name) · axial · 0.66mm/px · z∈[-531,-240]mm · 13 of 321 slices shown]
[im 15/321  lung]
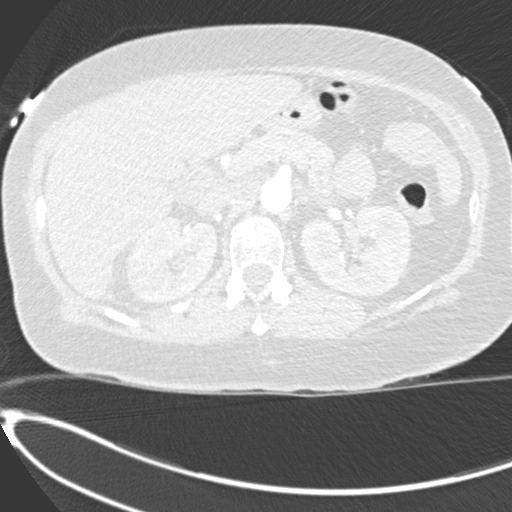
[im 44/321  soft-tissue]
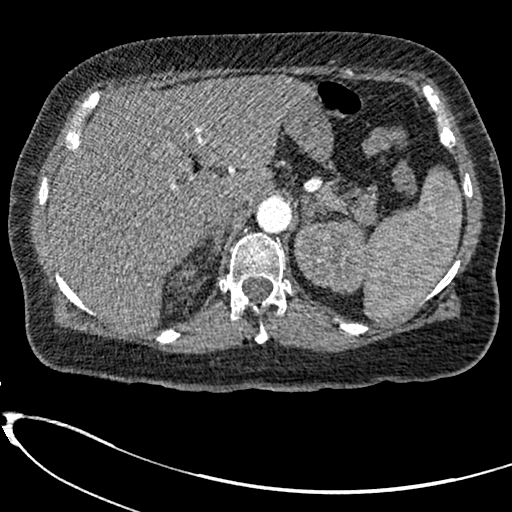
[im 73/321  lung]
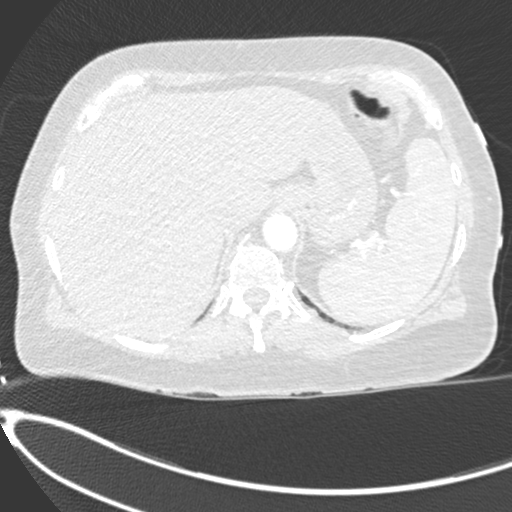
[im 88/321  soft-tissue]
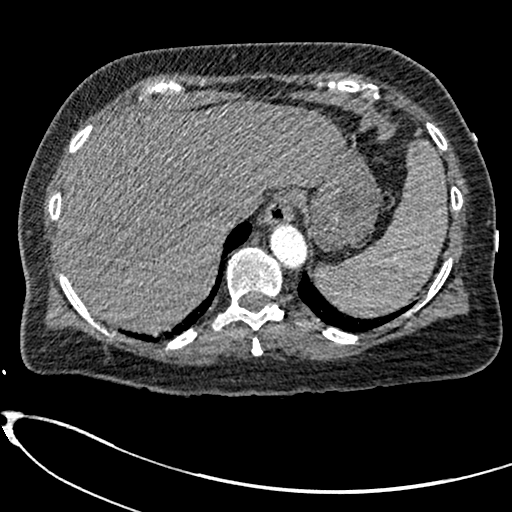
[im 117/321  lung]
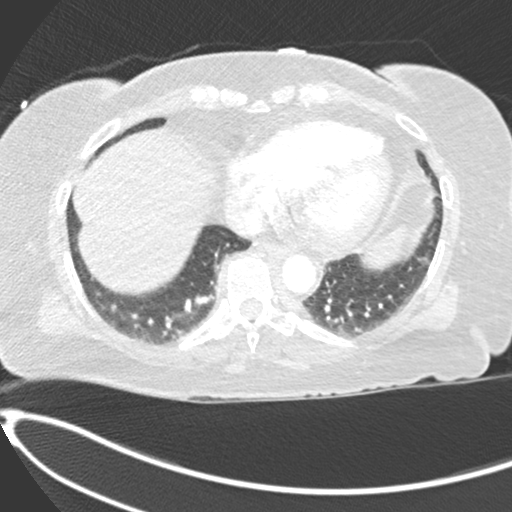
[im 131/321  soft-tissue]
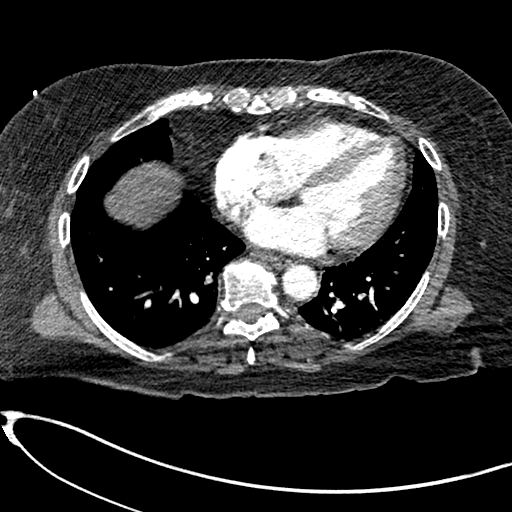
[im 161/321  lung]
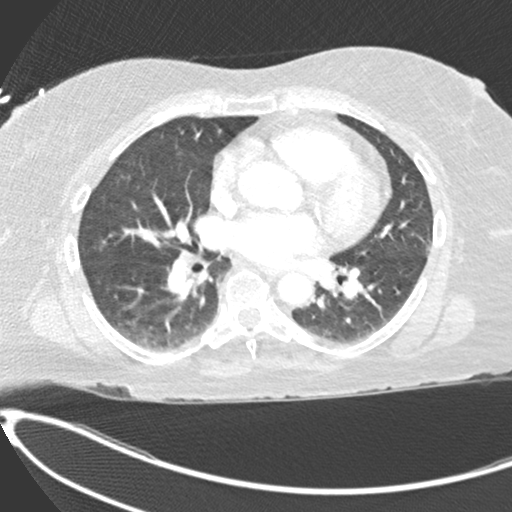
[im 190/321  soft-tissue]
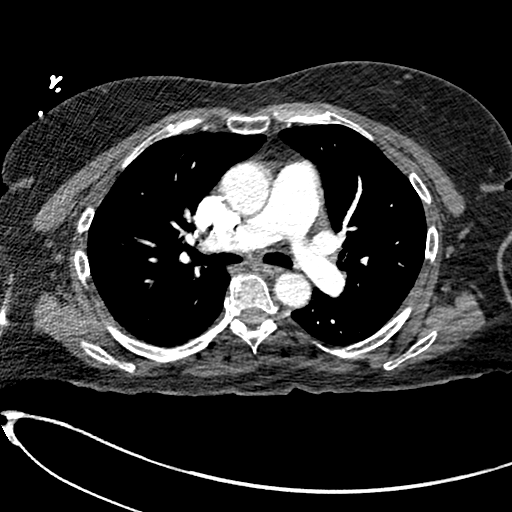
[im 204/321  lung]
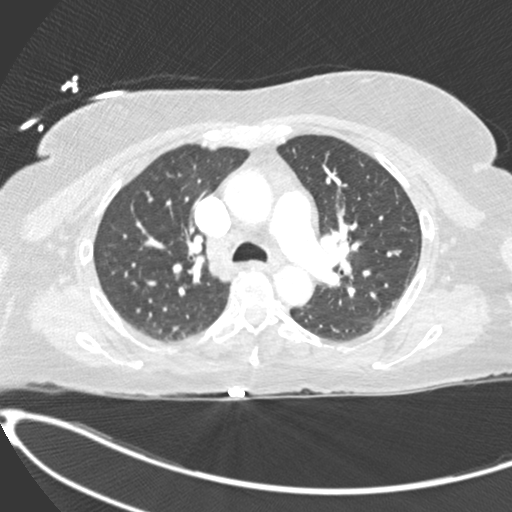
[im 233/321  soft-tissue]
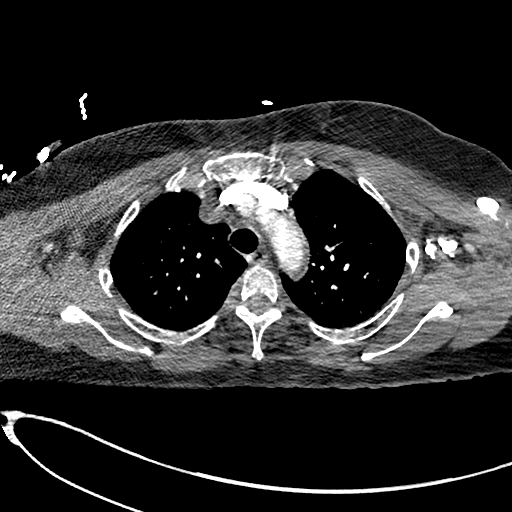
[im 248/321  lung]
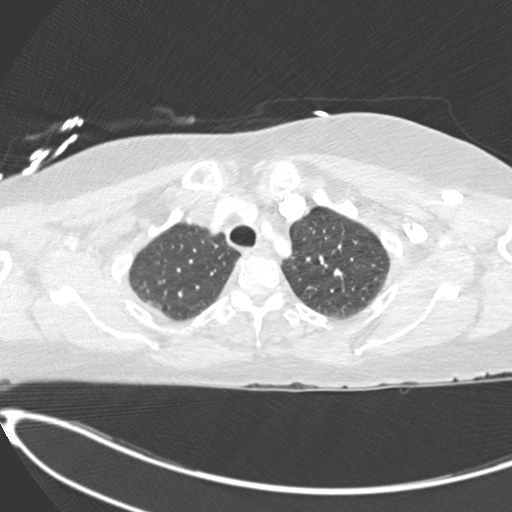
[im 277/321  soft-tissue]
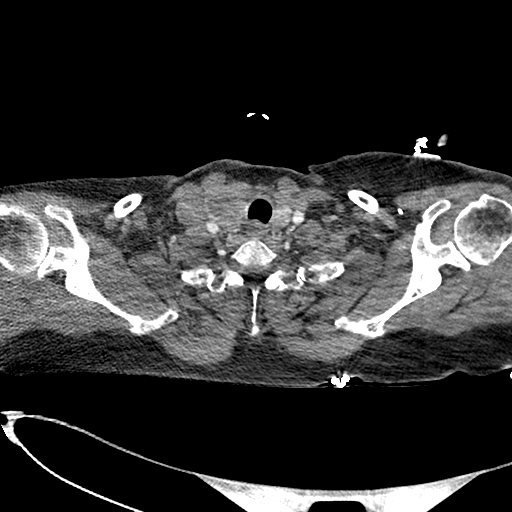
[im 306/321  lung]
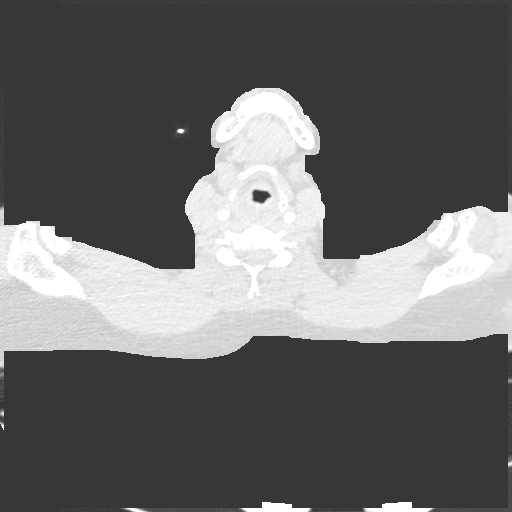

[Series 8: coronal mpr · coronal · 0.63mm/px · 3 of 150 slices shown]
[im 38/150  soft-tissue]
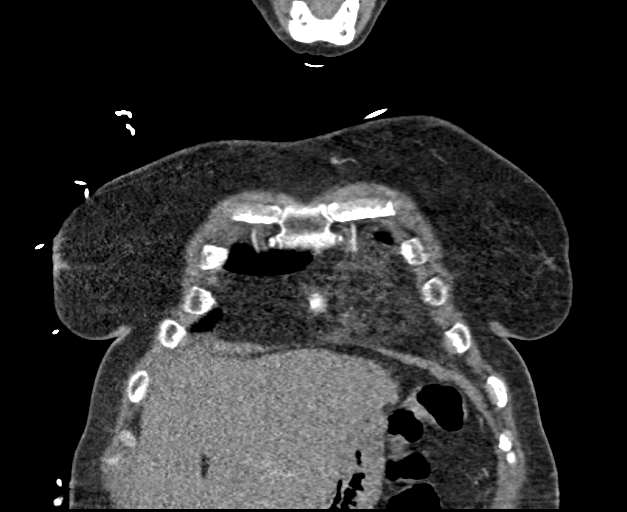
[im 75/150  soft-tissue]
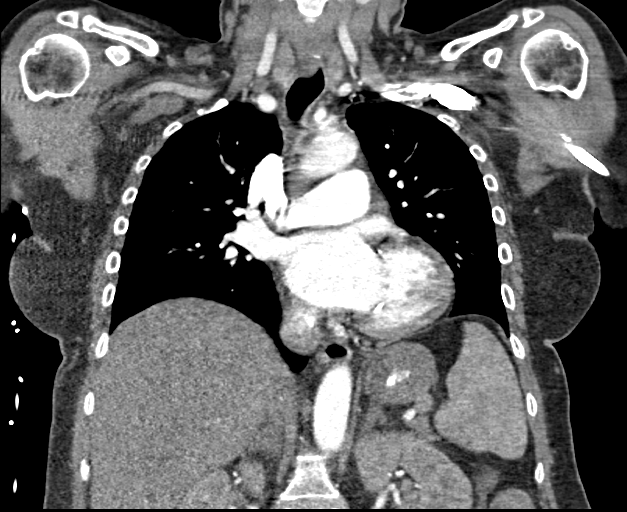
[im 112/150  soft-tissue]
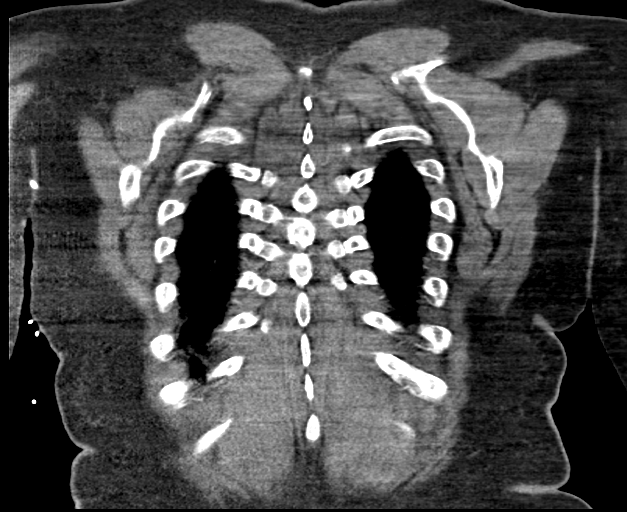

[16 of 46 positions shown; findings below may reference images not displayed]

RADIATION DOSE REDUCTION: This exam was performed according to the
departmental dose-optimization program which includes automated
exposure control, adjustment of the mA and/or kV according to
patient size and/or use of iterative reconstruction technique.

CONTRAST:  100mL OMNIPAQUE IOHEXOL 350 MG/ML SOLN
FINDINGS: CTA CHEST FINDINGS

Cardiovascular: Satisfactory opacification of the pulmonary arteries
to the segmental level. No evidence of pulmonary embolism. Normal
heart size. No significant pericardial effusion. The thoracic aorta
is normal in caliber. No atherosclerotic plaque of the thoracic
aorta. At least 1 vessel coronary artery calcifications.

Mediastinum/Nodes: No enlarged mediastinal, hilar, or axillary lymph
nodes. Trachea and esophagus demonstrate no significant findings.
There is a 1.9 cm hypodense right thyroid gland nodule.

Lungs/Pleura: Bilateral lower lobe subsegmental atelectasis. No
focal consolidation. No pulmonary nodule. No pulmonary mass. No
pleural effusion. No pneumothorax.

Musculoskeletal:

No chest wall abnormality.

No suspicious lytic or blastic osseous lesions. No acute displaced
fracture.

Review of the MIP images confirms the above findings.

CT ABDOMEN and PELVIS FINDINGS

Hepatobiliary: The liver is enlarged measuring up to 19 cm. No focal
liver abnormality. No gallstones, gallbladder wall thickening, or
pericholecystic fluid. No biliary dilatation.

Pancreas: No focal lesion. Normal pancreatic contour. No surrounding
inflammatory changes. No main pancreatic ductal dilatation.

Spleen: Normal in size without focal abnormality.

Adrenals/Urinary Tract:

No adrenal nodule bilaterally.

Striated right nephrogram. The left kidney enhances homogeneously.
No abscess formation. No nephroureterolithiasis bilaterally.

No hydronephrosis. No hydroureter.

The urinary bladder is unremarkable.

On delayed imaging, there is no urothelial wall thickening and there
are no filling defects in the opacified portions of the bilateral
collecting systems or ureters.

Stomach/Bowel: Stomach is within normal limits. No evidence of bowel
wall thickening or dilatation. Scattered colonic diverticulosis. The
appendix is not definitely identified with no inflammatory changes
in the right lower quadrant to suggest acute appendicitis.

Vascular/Lymphatic: No abdominal aorta or iliac aneurysm. Mild
atherosclerotic plaque of the aorta and its branches. No abdominal,
pelvic, or inguinal lymphadenopathy.

Reproductive: Status post hysterectomy. No adnexal masses.

Other: No intraperitoneal free fluid. No intraperitoneal free gas.
No organized fluid collection.

Musculoskeletal:

No abdominal wall hernia or abnormality.

No suspicious lytic or blastic osseous lesions. No acute displaced
fracture.

Review of the MIP images confirms the above findings.
IMPRESSION: 1. No pulmonary embolus.
2. No acute intrapulmonary abnormality.
3. Right striated nephrogram suggestive of pyelonephritis. No
definite abscess formation. Correlate with urinalysis.
4. A 1.9 cm hypodense right thyroid gland nodule. Recommend thyroid
US (ref: [HOSPITAL]. [DATE]): 143-50).
5. Colonic diverticulosis with no acute diverticulitis.
6. Hepatomegaly.

## 2023-08-27 IMAGING — CT CT HEAD W/O CM
3 of 4 series · 15 of 47 positions shown, 18 images · non-contrast
Comparison: None.

CLINICAL DATA: Headache, chronic, new features or increased
frequency



[Series 3: head 5.0 h30s · axial · 0.48mm/px · z∈[-152,-32]mm · 9 of 29 slices shown, 12 images]
[im 3/29  brain]
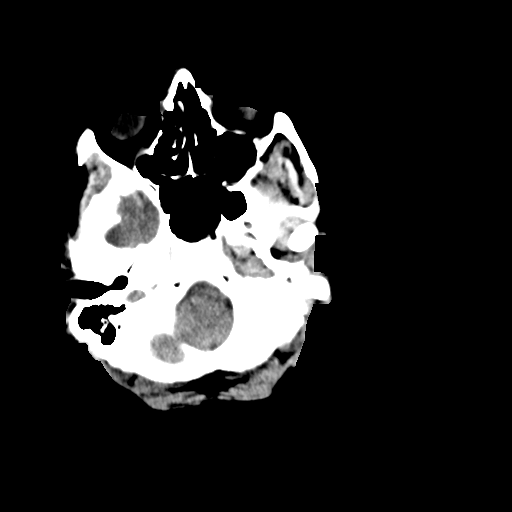
[im 3/29  bone]
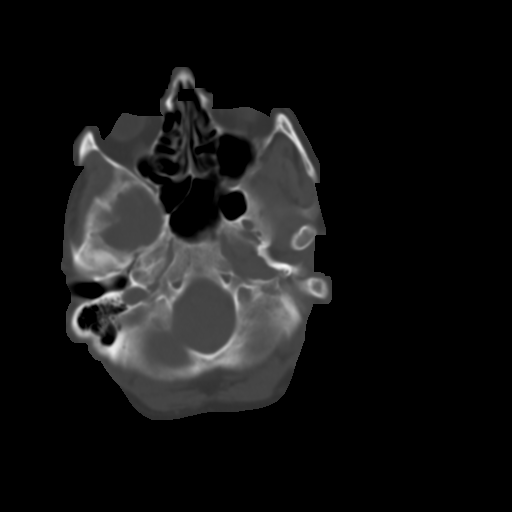
[im 7/29  brain]
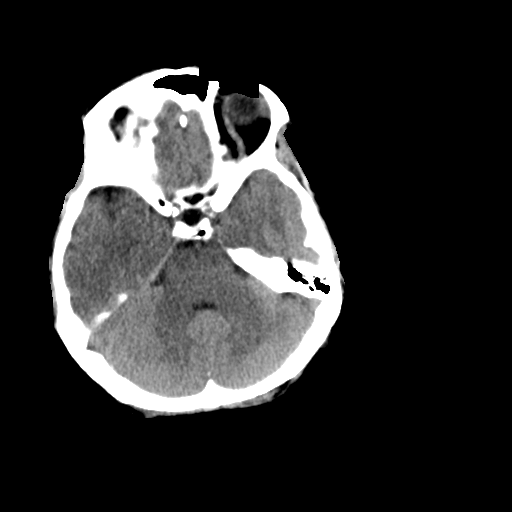
[im 9/29  brain]
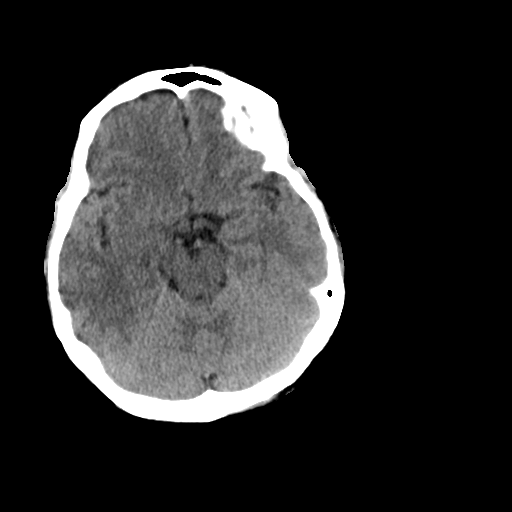
[im 13/29  brain]
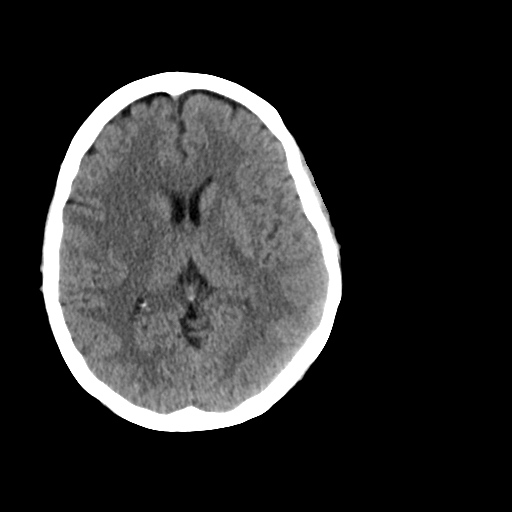
[im 15/29  brain]
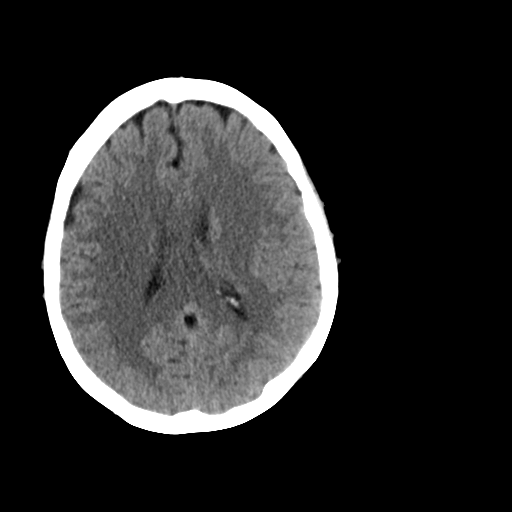
[im 15/29  bone]
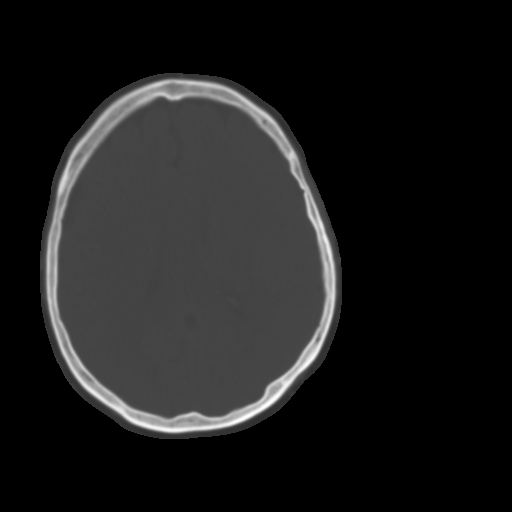
[im 17/29  brain]
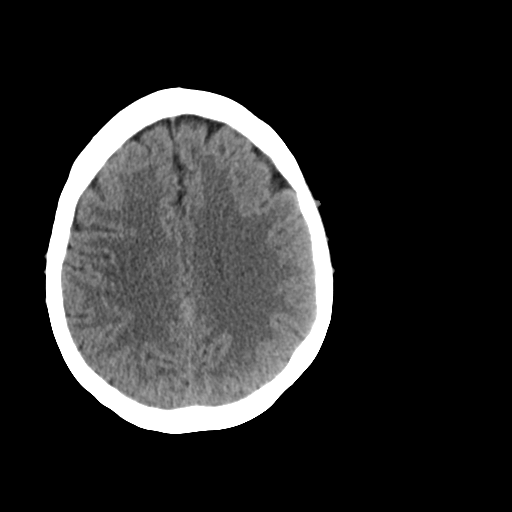
[im 21/29  brain]
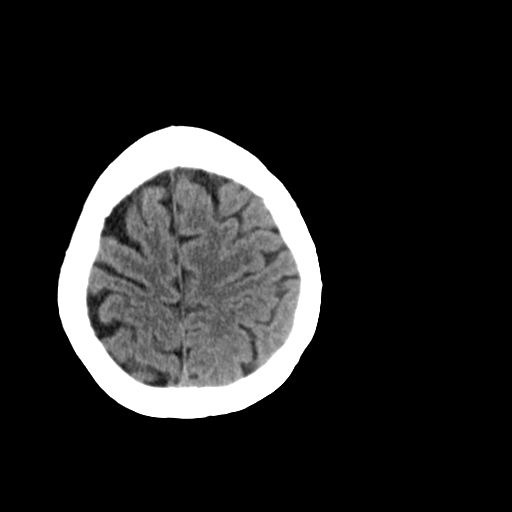
[im 23/29  brain]
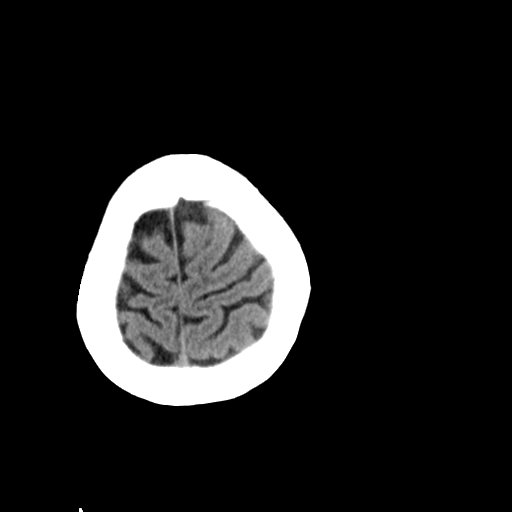
[im 27/29  brain]
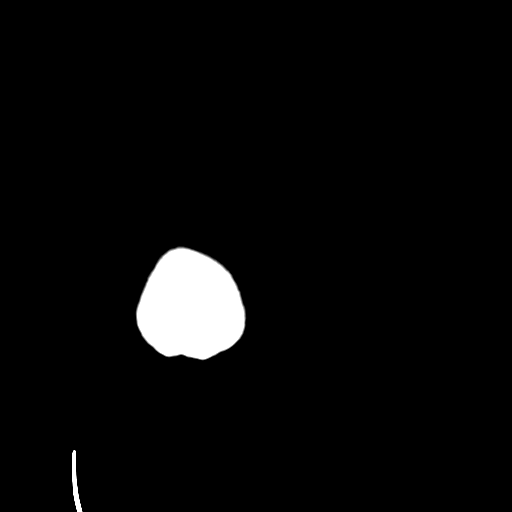
[im 27/29  bone]
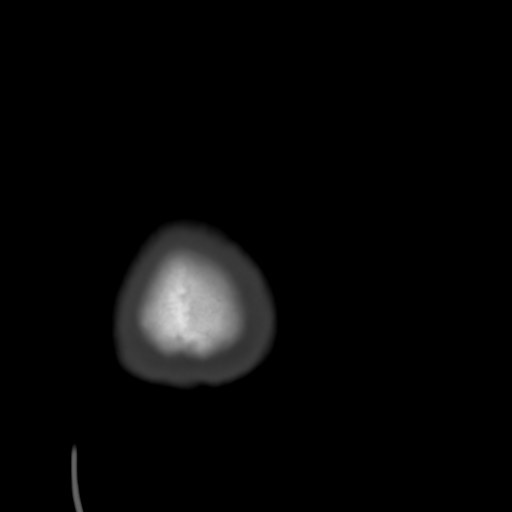

[Series 5: head 3.0 mpr cor · coronal · 0.32mm/px · 3 of 67 slices shown]
[im 23/67  brain]
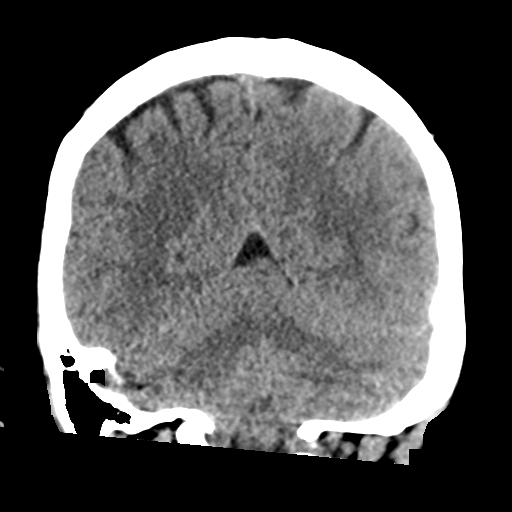
[im 30/67  brain]
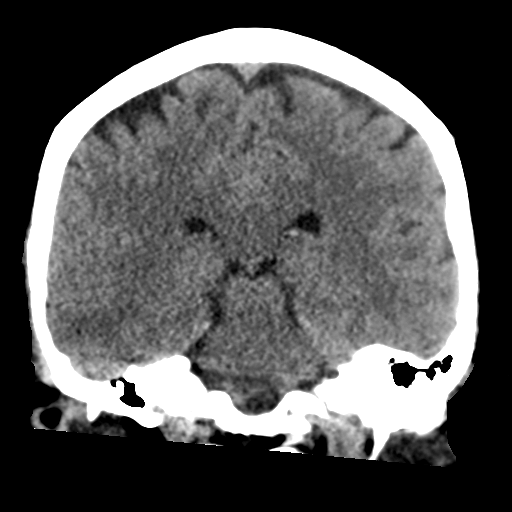
[im 37/67  brain]
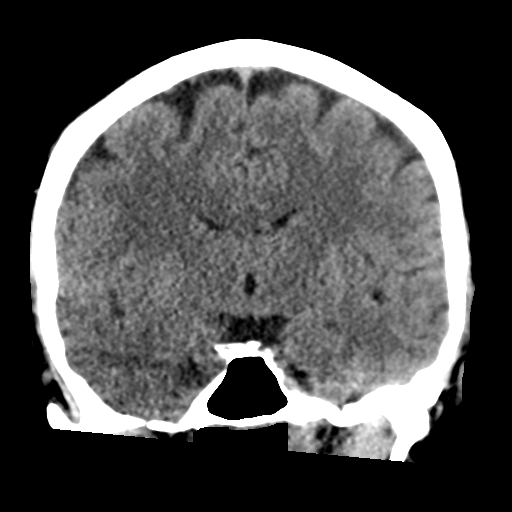

[Series 6: head 3.0 mpr sag · sagittal · 0.28mm/px · 3 of 58 slices shown]
[im 20/58  brain]
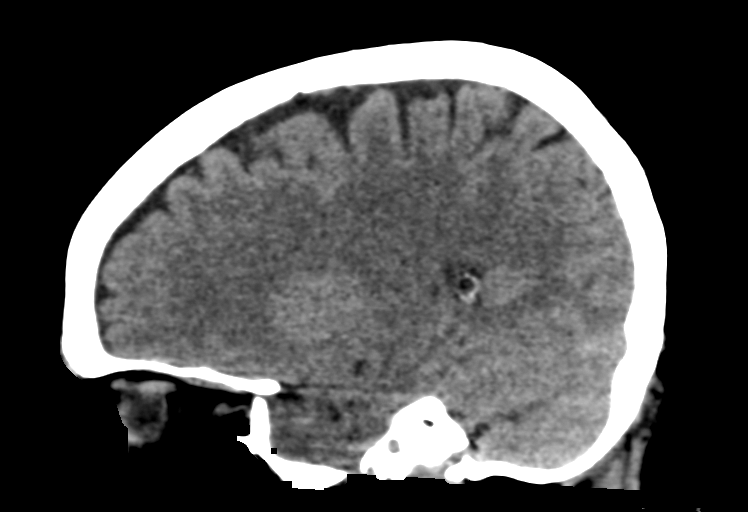
[im 29/58  brain]
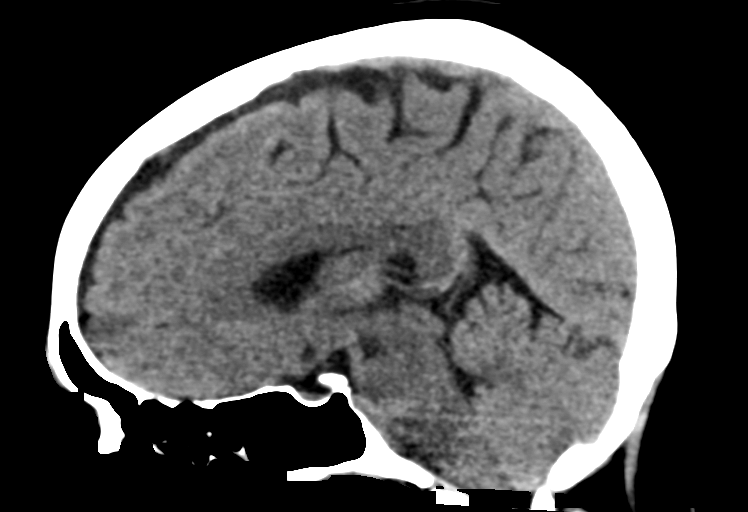
[im 39/58  brain]
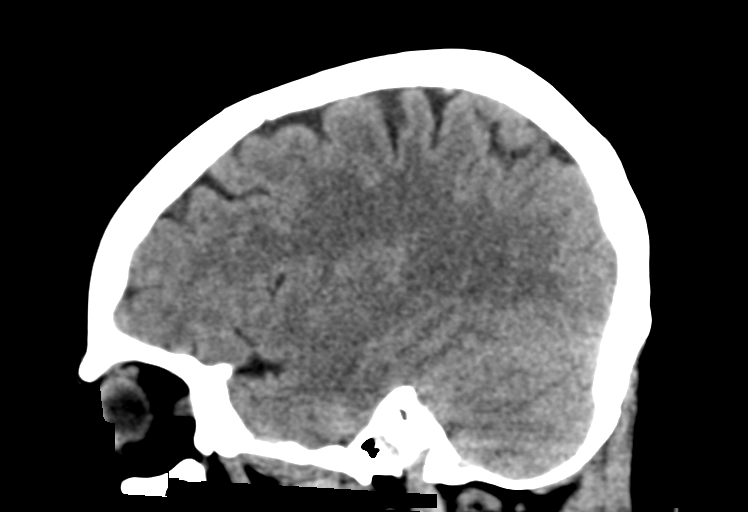

[15 of 47 positions shown; findings below may reference images not displayed]

FINDINGS: Brain:

No evidence of large-territorial acute infarction. No parenchymal
hemorrhage. No mass lesion. No extra-axial collection.

No mass effect or midline shift. No hydrocephalus. Basilar cisterns
are patent.

Vascular: No hyperdense vessel. Atherosclerotic calcifications are
present within the cavernous internal carotid arteries.

Skull: No acute fracture or focal lesion.

Sinuses/Orbits: Paranasal sinuses and mastoid air cells are clear.
The orbits are unremarkable.

Other: None.
IMPRESSION: Negative for acute traumatic injury.

## 2023-11-15 ENCOUNTER — Other Ambulatory Visit: Payer: Self-pay | Admitting: Nurse Practitioner

## 2023-11-15 DIAGNOSIS — G4452 New daily persistent headache (NDPH): Secondary | ICD-10-CM

## 2023-11-15 DIAGNOSIS — R431 Parosmia: Secondary | ICD-10-CM

## 2023-11-17 ENCOUNTER — Ambulatory Visit
Admission: RE | Admit: 2023-11-17 | Discharge: 2023-11-17 | Disposition: A | Discharge status: Home / Self Care | Source: Ambulatory Visit | Attending: Nurse Practitioner | Admitting: Nurse Practitioner

## 2023-11-17 DIAGNOSIS — G4452 New daily persistent headache (NDPH): Secondary | ICD-10-CM | POA: Diagnosis present

## 2023-11-17 DIAGNOSIS — R431 Parosmia: Secondary | ICD-10-CM | POA: Insufficient documentation

## 2023-11-17 MED ORDER — IOHEXOL 300 MG/ML  SOLN
75.0000 mL | Freq: Once | INTRAMUSCULAR | Status: AC | PRN
  Administered 2023-11-17: 75 mL via INTRAVENOUS
